# Patient Record
Sex: Male | Born: 1940 | Race: White | Hispanic: No | Marital: Married | Smoking: Former smoker
Health system: Southern US, Community
[De-identification: ages and names within clinical notes are randomized; demographics above are authoritative.]

## PROBLEM LIST (undated history)

## (undated) DIAGNOSIS — M109 Gout, unspecified: Secondary | ICD-10-CM

## (undated) DIAGNOSIS — Z8719 Personal history of other diseases of the digestive system: Secondary | ICD-10-CM

## (undated) DIAGNOSIS — E7800 Pure hypercholesterolemia, unspecified: Secondary | ICD-10-CM

## (undated) DIAGNOSIS — K219 Gastro-esophageal reflux disease without esophagitis: Secondary | ICD-10-CM

## (undated) HISTORY — PX: HERNIA REPAIR: SHX51

## (undated) HISTORY — PX: APPENDECTOMY: SHX54

---

## 2002-09-19 ENCOUNTER — Ambulatory Visit (HOSPITAL_COMMUNITY): Admission: RE | Admit: 2002-09-19 | Discharge: 2002-09-19 | Payer: Self-pay | Admitting: Gastroenterology

## 2002-10-12 ENCOUNTER — Emergency Department (HOSPITAL_COMMUNITY): Admission: EM | Admit: 2002-10-12 | Discharge: 2002-10-12 | Payer: Self-pay | Admitting: Emergency Medicine

## 2002-10-12 ENCOUNTER — Encounter: Payer: Self-pay | Admitting: Emergency Medicine

## 2011-10-19 ENCOUNTER — Emergency Department (HOSPITAL_COMMUNITY): Payer: Medicare Other

## 2011-10-19 ENCOUNTER — Other Ambulatory Visit: Payer: Self-pay

## 2011-10-19 ENCOUNTER — Encounter (HOSPITAL_COMMUNITY): Payer: Self-pay | Admitting: Emergency Medicine

## 2011-10-19 ENCOUNTER — Emergency Department (HOSPITAL_COMMUNITY)
Admission: EM | Admit: 2011-10-19 | Discharge: 2011-10-20 | Disposition: A | Discharge status: Home / Self Care | Payer: Medicare Other | Attending: Emergency Medicine | Admitting: Emergency Medicine

## 2011-10-19 DIAGNOSIS — R509 Fever, unspecified: Secondary | ICD-10-CM

## 2011-10-19 DIAGNOSIS — R109 Unspecified abdominal pain: Secondary | ICD-10-CM | POA: Insufficient documentation

## 2011-10-19 DIAGNOSIS — M546 Pain in thoracic spine: Secondary | ICD-10-CM | POA: Insufficient documentation

## 2011-10-19 DIAGNOSIS — R079 Chest pain, unspecified: Secondary | ICD-10-CM | POA: Insufficient documentation

## 2011-10-19 LAB — DIFFERENTIAL
Eosinophils Absolute: 0.1 10*3/uL (ref 0.0–0.7)
Lymphocytes Relative: 9 % — ABNORMAL LOW (ref 12–46)
Lymphs Abs: 1.7 10*3/uL (ref 0.7–4.0)
Monocytes Relative: 6 % (ref 3–12)
Neutro Abs: 15.5 10*3/uL — ABNORMAL HIGH (ref 1.7–7.7)
Neutrophils Relative %: 84 % — ABNORMAL HIGH (ref 43–77)

## 2011-10-19 LAB — URINE MICROSCOPIC-ADD ON

## 2011-10-19 LAB — POCT I-STAT TROPONIN I

## 2011-10-19 LAB — URINALYSIS, ROUTINE W REFLEX MICROSCOPIC
Glucose, UA: NEGATIVE mg/dL
Ketones, ur: NEGATIVE mg/dL
Leukocytes, UA: NEGATIVE
Nitrite: NEGATIVE
pH: 6 (ref 5.0–8.0)

## 2011-10-19 LAB — CBC
Hemoglobin: 16.8 g/dL (ref 13.0–17.0)
MCH: 29.7 pg (ref 26.0–34.0)
Platelets: 219 10*3/uL (ref 150–400)
RBC: 5.65 MIL/uL (ref 4.22–5.81)
WBC: 18.5 10*3/uL — ABNORMAL HIGH (ref 4.0–10.5)

## 2011-10-19 LAB — BASIC METABOLIC PANEL
BUN: 16 mg/dL (ref 6–23)
CO2: 24 mEq/L (ref 19–32)
Chloride: 103 mEq/L (ref 96–112)
GFR calc non Af Amer: 52 mL/min — ABNORMAL LOW (ref >90)
Glucose, Bld: 117 mg/dL — ABNORMAL HIGH (ref 70–99)
Potassium: 4 mEq/L (ref 3.5–5.1)
Sodium: 139 mEq/L (ref 135–145)

## 2011-10-19 LAB — HEPATIC FUNCTION PANEL
ALT: 21 U/L (ref 0–53)
AST: 20 U/L (ref 0–37)
Bilirubin, Direct: <0.1 mg/dL (ref 0.0–0.3)
Total Protein: 7.5 g/dL (ref 6.0–8.3)

## 2011-10-19 MED ORDER — ACETAMINOPHEN 325 MG PO TABS
975.0000 mg | ORAL_TABLET | Freq: Once | ORAL | Status: AC
  Administered 2011-10-19: 975 mg via ORAL
  Filled 2011-10-19: qty 3

## 2011-10-19 NOTE — ED Provider Notes (Signed)
History     CSN: [id number]  Arrival date & time 10/19/11  2017   First MD Initiated Contact with Patient 10/19/11 2107      Chief Complaint  Patient presents with  . Chest Pain    (Consider location/radiation/quality/duration/timing/severity/associated sxs/prior treatment) HPI Complains of right flank pain radiating to epigastrium onset this morning constant mild at present no shortness of breath no nausea no cough no other complaint discomfort is mild at present nothing makes symptoms better or worse no treatment prior to coming here History reviewed. No pertinent past medical history.  Past Surgical History  Procedure Date  . Cholecystectomy     No family history on file.  History  Substance Use Topics  . Smoking status: Never Smoker   . Smokeless tobacco: Not on file  . Alcohol Use: No     former smoker quit 1984, no alcohol since 1984  Review of Systems  Constitutional: Negative.   HENT: Negative.   Respiratory: Negative.   Cardiovascular: Negative.   Gastrointestinal: Positive for abdominal pain.  Genitourinary: Positive for flank pain.  Musculoskeletal: Negative.   Skin: Negative.   Neurological: Negative.   Hematological: Negative.   Psychiatric/Behavioral: Negative.     Allergies  Review of patient's allergies indicates no known allergies.  Home Medications   Current Outpatient Rx  Name Route Sig Dispense Refill  . FENOFIBRATE MICRONIZED 134 MG PO CAPS Oral Take 134 mg by mouth daily before breakfast.    . OMEPRAZOLE 20 MG PO CPDR Oral Take 20 mg by mouth daily.      BP 184/99  Pulse 109  Temp(Src) 99 F (37.2 C) (Oral)  Resp 20  Wt 167 lb (75.751 kg)  SpO2 98%  Physical Exam  Nursing note and vitals reviewed. Constitutional: He appears well-developed and well-nourished.  HENT:  Head: Normocephalic and atraumatic.  Eyes: Conjunctivae are normal. Pupils are equal, round, and reactive to light.  Neck: Neck supple. No tracheal deviation  present. No thyromegaly present.  Cardiovascular: Regular rhythm.   No murmur heard. Pulmonary/Chest: Effort normal and breath sounds normal.  Abdominal: Soft. Bowel sounds are normal. He exhibits no distension. There is no tenderness. There is no rebound.  Genitourinary: Penis normal.       Mild right flank tenderness  Musculoskeletal: Normal range of motion. He exhibits no edema and no tenderness.  Neurological: He is alert. Coordination normal.  Skin: Skin is warm and dry. No rash noted.  Psychiatric: He has a normal mood and affect.    ED Course  Procedures (including critical care time)   Labs Reviewed  I-STAT TROPONIN I  CBC  DIFFERENTIAL  BASIC METABOLIC PANEL   No results found.   No diagnosis found.  Date: 10/19/2011  Rate: 110  Rhythm: sinus tachycardia  QRS Axis: normal  Intervals: normal  ST/T Wave abnormalities: normal  Conduction Disutrbances:none  Narrative Interpretation:   Old EKG Reviewed: none available  Results for orders placed during the hospital encounter of 10/19/11  CBC      Component Value Range   WBC 18.5 (*) 4.0 - 10.5 (K/uL)   RBC 5.65  4.22 - 5.81 (MIL/uL)   Hemoglobin 16.8  13.0 - 17.0 (g/dL)   HCT 08.6  57.8 - 46.9 (%)   MCV 83.9  78.0 - 100.0 (fL)   MCH 29.7  26.0 - 34.0 (pg)   MCHC 35.4  30.0 - 36.0 (g/dL)   RDW 62.9  52.8 - 41.3 (%)   Platelets 219  150 - 400 (K/uL)  DIFFERENTIAL      Component Value Range   Neutrophils Relative 84 (*) 43 - 77 (%)   Neutro Abs 15.5 (*) 1.7 - 7.7 (K/uL)   Lymphocytes Relative 9 (*) 12 - 46 (%)   Lymphs Abs 1.7  0.7 - 4.0 (K/uL)   Monocytes Relative 6  3 - 12 (%)   Monocytes Absolute 1.1 (*) 0.1 - 1.0 (K/uL)   Eosinophils Relative 1  0 - 5 (%)   Eosinophils Absolute 0.1  0.0 - 0.7 (K/uL)   Basophils Relative 0  0 - 1 (%)   Basophils Absolute 0.0  0.0 - 0.1 (K/uL)  BASIC METABOLIC PANEL      Component Value Range   Sodium 139  135 - 145 (mEq/L)   Potassium 4.0  3.5 - 5.1 (mEq/L)    Chloride 103  96 - 112 (mEq/L)   CO2 24  19 - 32 (mEq/L)   Glucose, Bld 117 (*) 70 - 99 (mg/dL)   BUN 16  6 - 23 (mg/dL)   Creatinine, Ser 6.96  0.50 - 1.35 (mg/dL)   Calcium 29.5  8.4 - 10.5 (mg/dL)   GFR calc non Af Amer 52 (*) >90 (mL/min)   GFR calc Af Amer 61 (*) >90 (mL/min)  POCT I-STAT TROPONIN I      Component Value Range   Troponin i, poc 0.00  0.00 - 0.08 (ng/mL)   Comment 3           URINALYSIS, ROUTINE W REFLEX MICROSCOPIC      Component Value Range   Color, Urine YELLOW  YELLOW    APPearance CLEAR  CLEAR    Specific Gravity, Urine 1.020  1.005 - 1.030    pH 6.0  5.0 - 8.0    Glucose, UA NEGATIVE  NEGATIVE (mg/dL)   Hgb urine dipstick TRACE (*) NEGATIVE    Bilirubin Urine NEGATIVE  NEGATIVE    Ketones, ur NEGATIVE  NEGATIVE (mg/dL)   Protein, ur NEGATIVE  NEGATIVE (mg/dL)   Urobilinogen, UA 0.2  0.0 - 1.0 (mg/dL)   Nitrite NEGATIVE  NEGATIVE    Leukocytes, UA NEGATIVE  NEGATIVE   HEPATIC FUNCTION PANEL      Component Value Range   Total Protein 7.5  6.0 - 8.3 (g/dL)   Albumin 4.2  3.5 - 5.2 (g/dL)   AST 20  0 - 37 (U/L)   ALT 21  0 - 53 (U/L)   Alkaline Phosphatase 57  39 - 117 (U/L)   Total Bilirubin 0.4  0.3 - 1.2 (mg/dL)   Bilirubin, Direct <2.8  0.0 - 0.3 (mg/dL)   Indirect Bilirubin NOT CALCULATED  0.3 - 0.9 (mg/dL)  URINE MICROSCOPIC-ADD ON      Component Value Range   Squamous Epithelial / LPF RARE  RARE    WBC, UA 0-2  <3 (WBC/hpf)   RBC / HPF 0-2  <3 (RBC/hpf)   Bacteria, UA RARE  RARE    Dg Chest 2 View  10/19/2011  *RADIOLOGY REPORT*  Clinical Data: Chest pain and pressure.  Mid back pain.  CHEST - 2 VIEW  Comparison: None.  Findings: Shallow inspiration.  Linear atelectasis or infiltration in the lung bases.  Normal heart size and pulmonary vascularity. No blunting of costophrenic angles.  No pneumothorax.  IMPRESSION: Linear atelectasis or infiltration in the lung bases.  Original Report Authenticated By: Marlon Pel, M.D.   12 midnight.  Patient resting comfortably and feels improved after  treatment with Tylenol  MDM  No source of fever identified. Doubt pneumonia no cough lungs clear auscultation Plan Tylenol Patient is encouraged to get reevaluated by his primary care Dr. within 24 hours Diagnosis febrile illness        Doug Sou, MD 10/20/11 1610

## 2011-10-19 NOTE — ED Notes (Signed)
PT. REPORTS MIDSTERNAL CHEST /EPIGASTRIC PAIN RADIATING TO RIGHT UPPER BACK ONSET THIS MORNING , DENIES SOB , SLIGHT NAUSEA, TOOK 2 REGULAR ASA PRIOR TO ARRIVAL.

## 2011-10-19 NOTE — ED Notes (Addendum)
Pt states he was having right flank pain around 0500 this morning.  The pain progressed to epigastric - non-radiating.  Pt states he felt nauseous but denies any vomiting or SOB.  Pt does state he takes medication for acid reflux but cannot remember the name of it.

## 2015-05-16 ENCOUNTER — Ambulatory Visit
Admission: RE | Admit: 2015-05-16 | Discharge: 2015-05-16 | Disposition: A | Discharge status: Home / Self Care | Payer: Medicare Other | Source: Ambulatory Visit | Attending: Family Medicine | Admitting: Family Medicine

## 2015-05-16 ENCOUNTER — Other Ambulatory Visit: Payer: Self-pay | Admitting: Family Medicine

## 2015-05-16 DIAGNOSIS — M25572 Pain in left ankle and joints of left foot: Secondary | ICD-10-CM

## 2016-06-24 ENCOUNTER — Encounter: Payer: Self-pay | Admitting: *Deleted

## 2016-06-26 NOTE — Discharge Instructions (Signed)

## 2016-07-01 ENCOUNTER — Ambulatory Visit: Payer: Medicare Other | Admitting: Anesthesiology

## 2016-07-01 ENCOUNTER — Ambulatory Visit
Admission: RE | Admit: 2016-07-01 | Discharge: 2016-07-01 | Disposition: A | Discharge status: Home / Self Care | Payer: Medicare Other | Source: Ambulatory Visit | Attending: Ophthalmology | Admitting: Ophthalmology

## 2016-07-01 ENCOUNTER — Encounter
Admission: RE | Disposition: A | Discharge status: Home / Self Care | Payer: Self-pay | Source: Ambulatory Visit | Attending: Ophthalmology

## 2016-07-01 DIAGNOSIS — M109 Gout, unspecified: Secondary | ICD-10-CM | POA: Diagnosis not present

## 2016-07-01 DIAGNOSIS — H2511 Age-related nuclear cataract, right eye: Secondary | ICD-10-CM | POA: Diagnosis not present

## 2016-07-01 DIAGNOSIS — E7800 Pure hypercholesterolemia, unspecified: Secondary | ICD-10-CM | POA: Diagnosis not present

## 2016-07-01 DIAGNOSIS — Z87891 Personal history of nicotine dependence: Secondary | ICD-10-CM | POA: Insufficient documentation

## 2016-07-01 DIAGNOSIS — Z79899 Other long term (current) drug therapy: Secondary | ICD-10-CM | POA: Insufficient documentation

## 2016-07-01 HISTORY — PX: CATARACT EXTRACTION W/PHACO: SHX586

## 2016-07-01 HISTORY — DX: Gout, unspecified: M10.9

## 2016-07-01 HISTORY — DX: Pure hypercholesterolemia, unspecified: E78.00

## 2016-07-01 HISTORY — DX: Gastro-esophageal reflux disease without esophagitis: K21.9

## 2016-07-01 HISTORY — DX: Personal history of other diseases of the digestive system: Z87.19

## 2016-07-01 SURGERY — PHACOEMULSIFICATION, CATARACT, WITH IOL INSERTION
Anesthesia: Monitor Anesthesia Care | Laterality: Right | Wound class: Clean

## 2016-07-01 MED ORDER — LIDOCAINE HCL (PF) 4 % IJ SOLN
INTRAMUSCULAR | Status: DC | PRN
  Administered 2016-07-01: 09:00:00 via OPHTHALMIC

## 2016-07-01 MED ORDER — SODIUM HYALURONATE 23 MG/ML IO SOLN
INTRAOCULAR | Status: DC | PRN
  Administered 2016-07-01: 0.6 mL via INTRAOCULAR

## 2016-07-01 MED ORDER — FENTANYL CITRATE (PF) 100 MCG/2ML IJ SOLN
INTRAMUSCULAR | Status: DC | PRN
  Administered 2016-07-01: 50 ug via INTRAVENOUS

## 2016-07-01 MED ORDER — EPINEPHRINE PF 1 MG/ML IJ SOLN
INTRAOCULAR | Status: DC | PRN
  Administered 2016-07-01: 79 mL via OPHTHALMIC

## 2016-07-01 MED ORDER — SODIUM HYALURONATE 10 MG/ML IO SOLN
INTRAOCULAR | Status: DC | PRN
  Administered 2016-07-01: 0.85 mL via INTRAOCULAR

## 2016-07-01 MED ORDER — ARMC OPHTHALMIC DILATING DROPS
1.0000 "application " | OPHTHALMIC | Status: DC | PRN
  Administered 2016-07-01 (×3): 1 via OPHTHALMIC

## 2016-07-01 MED ORDER — MOXIFLOXACIN HCL 0.5 % OP SOLN
OPHTHALMIC | Status: DC | PRN
  Administered 2016-07-01: 1 [drp] via OPHTHALMIC

## 2016-07-01 MED ORDER — LIDOCAINE HCL (PF) 4 % IJ SOLN
INTRAOCULAR | Status: DC | PRN
  Administered 2016-07-01: 2 mL via OPHTHALMIC

## 2016-07-01 MED ORDER — MIDAZOLAM HCL 2 MG/2ML IJ SOLN
INTRAMUSCULAR | Status: DC | PRN
Start: 2016-07-01 — End: 2016-07-01
  Administered 2016-07-01: 2 mg via INTRAVENOUS

## 2016-07-01 MED ORDER — NEOMYCIN-POLYMYXIN-DEXAMETH 3.5-10000-0.1 OP OINT
TOPICAL_OINTMENT | OPHTHALMIC | Status: DC | PRN
  Administered 2016-07-01: 1 via OPHTHALMIC

## 2016-07-01 MED ORDER — ALFENTANIL 500 MCG/ML IJ INJ
INJECTION | INTRAMUSCULAR | Status: DC | PRN
  Administered 2016-07-01: 400 ug via INTRAVENOUS

## 2016-07-01 MED ORDER — LACTATED RINGERS IV SOLN: INTRAVENOUS | Status: DC

## 2016-07-01 SURGICAL SUPPLY — 18 items
CANNULA ANT/CHMB 27GA (MISCELLANEOUS) ×4 IMPLANT
CUP MEDICINE 2OZ PLAST GRAD ST (MISCELLANEOUS) ×2 IMPLANT
GLOVE BIO SURGEON STRL SZ8 (GLOVE) ×2 IMPLANT
GLOVE SURG LX 7.5 STRW (GLOVE) ×1
GLOVE SURG LX STRL 7.5 STRW (GLOVE) ×1 IMPLANT
GOWN STRL REUS W/ TWL LRG LVL3 (GOWN DISPOSABLE) ×2 IMPLANT
GOWN STRL REUS W/TWL LRG LVL3 (GOWN DISPOSABLE) ×2
LENS IOL TECNIS ITEC 23.5 (Intraocular Lens) ×2 IMPLANT
PACK CATARACT (MISCELLANEOUS) ×2 IMPLANT
PACK CATARACT BRASINGTON (MISCELLANEOUS) ×2 IMPLANT
PACK EYE AFTER SURG (MISCELLANEOUS) ×2 IMPLANT
SOL PREP PVP 2OZ (MISCELLANEOUS) ×2
SOLUTION PREP PVP 2OZ (MISCELLANEOUS) ×1 IMPLANT
SYR 3ML LL SCALE MARK (SYRINGE) ×4 IMPLANT
SYR 5ML LL (SYRINGE) ×2 IMPLANT
SYR TB 1ML 27GX1/2 LL (SYRINGE) ×2 IMPLANT
WATER STERILE IRR 250ML POUR (IV SOLUTION) ×2 IMPLANT
WIPE NON LINTING 3.25X3.25 (MISCELLANEOUS) ×2 IMPLANT

## 2016-07-01 NOTE — H&P (Signed)
The History and Physical notes are on paper, have been signed, and are to be scanned. The patient remains stable and unchanged from the H&P.   Previous H&P reviewed, patient examined, and there are no changes.  Brett Keller 07/01/2016 8:10 AM

## 2016-07-01 NOTE — Transfer of Care (Signed)
Immediate Anesthesia Transfer of Care Note  Patient: Brett Keller  Procedure(s) Performed: Procedure(s) with comments: CATARACT EXTRACTION PHACO AND INTRAOCULAR LENS PLACEMENT (IOC) (Right) - RIGHT  Patient Location: PACU  Anesthesia Type: MAC  Level of Consciousness: awake, alert  and patient cooperative  Airway and Oxygen Therapy: Patient Spontanous Breathing and Patient connected to supplemental oxygen  Post-op Assessment: Post-op Vital signs reviewed, Patient's Cardiovascular Status Stable, Respiratory Function Stable, Patent Airway and No signs of Nausea or vomiting  Post-op Vital Signs: Reviewed and stable  Complications: No apparent anesthesia complications

## 2016-07-01 NOTE — Anesthesia Procedure Notes (Signed)
Procedure Name: MAC Performed by: Archer Moist Pre-anesthesia Checklist: Patient identified, Emergency Drugs available, Suction available, Timeout performed and Patient being monitored Patient Re-evaluated:Patient Re-evaluated prior to inductionOxygen Delivery Method: Nasal cannula Placement Confirmation: positive ETCO2       

## 2016-07-01 NOTE — Anesthesia Preprocedure Evaluation (Signed)
Anesthesia Evaluation  Patient identified by MRN, date of birth, ID band  Reviewed: NPO status   History of Anesthesia Complications Negative for: history of anesthetic complications  Airway Mallampati: II  TM Distance: >3 FB Neck ROM: full    Dental  (+) Upper Dentures   Pulmonary neg pulmonary ROS, former smoker,    Pulmonary exam normal        Cardiovascular Exercise Tolerance: Good negative cardio ROS Normal cardiovascular exam  lipids   Neuro/Psych negative neurological ROS  negative psych ROS   GI/Hepatic Neg liver ROS, GERD  Controlled,  Endo/Other  negative endocrine ROS  Renal/GU negative Renal ROS  negative genitourinary   Musculoskeletal Gout    Abdominal   Peds  Hematology negative hematology ROS (+)   Anesthesia Other Findings   Reproductive/Obstetrics                             Anesthesia Physical Anesthesia Plan  ASA: II  Anesthesia Plan: MAC   Post-op Pain Management:    Induction:   Airway Management Planned:   Additional Equipment:   Intra-op Plan:   Post-operative Plan:   Informed Consent: I have reviewed the patients History and Physical, chart, labs and discussed the procedure including the risks, benefits and alternatives for the proposed anesthesia with the patient or authorized representative who has indicated his/her understanding and acceptance.     Plan Discussed with: CRNA  Anesthesia Plan Comments:         Anesthesia Quick Evaluation

## 2016-07-01 NOTE — Anesthesia Postprocedure Evaluation (Signed)
Anesthesia Post Note  Patient: Brett KleinRobert E Keller  Procedure(s) Performed: Procedure(s) (LRB): CATARACT EXTRACTION PHACO AND INTRAOCULAR LENS PLACEMENT (IOC) (Right)  Patient location during evaluation: PACU Anesthesia Type: MAC Level of consciousness: awake and alert Pain management: pain level controlled Vital Signs Assessment: post-procedure vital signs reviewed and stable Respiratory status: spontaneous breathing, nonlabored ventilation, respiratory function stable and patient connected to nasal cannula oxygen Cardiovascular status: stable and blood pressure returned to baseline Anesthetic complications: no    Lincoln Ginley

## 2016-07-01 NOTE — Op Note (Signed)
OPERATIVE NOTE  Stark KleinRobert E Klee [id number] 07/01/2016   PREOPERATIVE DIAGNOSIS:  Nuclear sclerotic cataract right eye.  H25.11   POSTOPERATIVE DIAGNOSIS:    Nuclear sclerotic cataract right eye.     PROCEDURE:  Phacoemusification with posterior chamber intraocular lens placement of the right eye   LENS:   Implant Name Type Inv. Item Serial No. Manufacturer Lot No. LRB No. Used  LENS IOL DIOP 23.5 - [id number] Intraocular Lens LENS IOL DIOP 23.5 [id number]   Right 1       PCB00 +23.5   ULTRASOUND TIME: 1 minutes 13 seconds.  CDE 6.94   SURGEON:  Willey BladeBradley Elishia Kaczorowski, MD, MPH  ANESTHESIOLOGIST: Anesthesiologist: Orrin Brighamebabrata Maji, MD CRNA: Jimmy PicketMichael Amyot, CRNA   ANESTHESIA:  Topical with tetracaine drops, augmented with 1% preservative-free intracameral lidocaine.  Also MAC and retrobulbar block with 50-50 mix of 2% lidocaine PF and 0.75% bupivicaine and 0.5 mL of hyaluronidase (hylenex), approx 4 mL total delivered.  ESTIMATED BLOOD LOSS: less than 1 mL.   COMPLICATIONS:  None.   DESCRIPTION OF PROCEDURE:  The patient was identified in the holding room and transported to the operating room and placed in the supine position under the operating microscope.  The right eye was identified as the operative eye and it was prepped and draped in the usual sterile ophthalmic fashion.   A 1.0 millimeter clear-corneal paracentesis was made at the 10:30 position.  The patient had a significant Bell's phenomenon, so a retrobulbar block was performed.   0.5 ml of preservative-free 1% lidocaine with epinephrine was injected into the anterior chamber.  The anterior chamber was filled with Healon 5 viscoelastic.  A 2.4 millimeter keratome was used to make a near-clear corneal incision at the 8:00 position.  A curvilinear capsulorrhexis was made with a cystotome and capsulorrhexis forceps.  Balanced salt solution was used to hydrodissect and hydrodelineate the nucleus.   Phacoemulsification was then  used in stop and chop fashion to remove the lens nucleus and epinucleus.  The remaining cortex was then removed using the irrigation and aspiration handpiece. Healon was then placed into the capsular bag to distend it for lens placement.  A lens was then injected into the capsular bag.  The remaining viscoelastic was aspirated.   Wounds were hydrated with balanced salt solution.  The anterior chamber was inflated to a physiologic pressure with balanced salt solution.   Intracameral vigamox 0.1 mL undiluted was injected into the eye.  No wound leaks were noted.  Topical Vigamox drops were applied to the eye.  The patient was taken to the recovery room in stable condition without complications of anesthesia or surgery  Willey BladeBradley Marlette Curvin 07/01/2016, 8:59 AM

## 2016-07-02 ENCOUNTER — Encounter: Payer: Self-pay | Admitting: Ophthalmology

## 2016-12-29 ENCOUNTER — Other Ambulatory Visit: Payer: Self-pay | Admitting: Family Medicine

## 2016-12-29 ENCOUNTER — Ambulatory Visit
Admission: RE | Admit: 2016-12-29 | Discharge: 2016-12-29 | Disposition: A | Discharge status: Home / Self Care | Payer: Medicare Other | Source: Ambulatory Visit | Attending: Family Medicine | Admitting: Family Medicine

## 2016-12-29 DIAGNOSIS — M79642 Pain in left hand: Secondary | ICD-10-CM

## 2017-05-08 DIAGNOSIS — Z125 Encounter for screening for malignant neoplasm of prostate: Secondary | ICD-10-CM | POA: Diagnosis not present

## 2017-05-08 DIAGNOSIS — Z1211 Encounter for screening for malignant neoplasm of colon: Secondary | ICD-10-CM | POA: Diagnosis not present

## 2017-05-08 DIAGNOSIS — N183 Chronic kidney disease, stage 3 (moderate): Secondary | ICD-10-CM | POA: Diagnosis not present

## 2017-05-08 DIAGNOSIS — Z9181 History of falling: Secondary | ICD-10-CM | POA: Diagnosis not present

## 2017-05-08 DIAGNOSIS — E782 Mixed hyperlipidemia: Secondary | ICD-10-CM | POA: Diagnosis not present

## 2017-05-08 DIAGNOSIS — M109 Gout, unspecified: Secondary | ICD-10-CM | POA: Diagnosis not present

## 2017-05-08 DIAGNOSIS — Z23 Encounter for immunization: Secondary | ICD-10-CM | POA: Diagnosis not present

## 2017-05-08 DIAGNOSIS — Z0000 Encounter for general adult medical examination without abnormal findings: Secondary | ICD-10-CM | POA: Diagnosis not present

## 2017-05-08 DIAGNOSIS — Z1389 Encounter for screening for other disorder: Secondary | ICD-10-CM | POA: Diagnosis not present

## 2017-05-08 DIAGNOSIS — E785 Hyperlipidemia, unspecified: Secondary | ICD-10-CM | POA: Diagnosis not present

## 2017-05-13 ENCOUNTER — Ambulatory Visit
Admission: RE | Admit: 2017-05-13 | Discharge: 2017-05-13 | Disposition: A | Discharge status: Home / Self Care | Payer: Medicare Other | Source: Ambulatory Visit | Attending: Physician Assistant | Admitting: Physician Assistant

## 2017-05-13 ENCOUNTER — Other Ambulatory Visit: Payer: Self-pay | Admitting: Physician Assistant

## 2017-05-13 DIAGNOSIS — M79642 Pain in left hand: Secondary | ICD-10-CM

## 2017-05-13 DIAGNOSIS — M109 Gout, unspecified: Secondary | ICD-10-CM | POA: Diagnosis not present

## 2017-05-13 DIAGNOSIS — M12842 Other specific arthropathies, not elsewhere classified, left hand: Secondary | ICD-10-CM | POA: Diagnosis not present

## 2017-05-13 DIAGNOSIS — E782 Mixed hyperlipidemia: Secondary | ICD-10-CM | POA: Diagnosis not present

## 2017-06-10 DIAGNOSIS — Z1212 Encounter for screening for malignant neoplasm of rectum: Secondary | ICD-10-CM | POA: Diagnosis not present

## 2017-06-10 DIAGNOSIS — Z1211 Encounter for screening for malignant neoplasm of colon: Secondary | ICD-10-CM | POA: Diagnosis not present

## 2017-09-07 DIAGNOSIS — H5212 Myopia, left eye: Secondary | ICD-10-CM | POA: Diagnosis not present

## 2017-09-07 DIAGNOSIS — H52223 Regular astigmatism, bilateral: Secondary | ICD-10-CM | POA: Diagnosis not present

## 2017-11-18 DIAGNOSIS — R7303 Prediabetes: Secondary | ICD-10-CM | POA: Diagnosis not present

## 2017-11-18 DIAGNOSIS — M109 Gout, unspecified: Secondary | ICD-10-CM | POA: Diagnosis not present

## 2017-11-18 DIAGNOSIS — E782 Mixed hyperlipidemia: Secondary | ICD-10-CM | POA: Diagnosis not present

## 2017-11-18 DIAGNOSIS — N400 Enlarged prostate without lower urinary tract symptoms: Secondary | ICD-10-CM | POA: Diagnosis not present

## 2018-03-19 DIAGNOSIS — L03211 Cellulitis of face: Secondary | ICD-10-CM | POA: Diagnosis not present

## 2018-03-19 DIAGNOSIS — N400 Enlarged prostate without lower urinary tract symptoms: Secondary | ICD-10-CM | POA: Diagnosis not present

## 2018-05-21 DIAGNOSIS — T63481A Toxic effect of venom of other arthropod, accidental (unintentional), initial encounter: Secondary | ICD-10-CM | POA: Diagnosis not present

## 2018-05-21 DIAGNOSIS — Z6828 Body mass index (BMI) 28.0-28.9, adult: Secondary | ICD-10-CM | POA: Diagnosis not present

## 2018-07-01 DIAGNOSIS — N400 Enlarged prostate without lower urinary tract symptoms: Secondary | ICD-10-CM | POA: Diagnosis not present

## 2018-07-01 DIAGNOSIS — R7303 Prediabetes: Secondary | ICD-10-CM | POA: Diagnosis not present

## 2018-07-01 DIAGNOSIS — E782 Mixed hyperlipidemia: Secondary | ICD-10-CM | POA: Diagnosis not present

## 2018-07-01 DIAGNOSIS — M109 Gout, unspecified: Secondary | ICD-10-CM | POA: Diagnosis not present

## 2018-07-03 IMAGING — CR DG HAND COMPLETE 3+V*L*
4 series · 4 of 4 positions shown · non-contrast
Comparison: 12/29/2016

CLINICAL DATA: Recent removal of foreign body from the left index
finger. Concern for retained foreign body.

EXAM:
LEFT HAND - COMPLETE 3+ VIEW

[x hand pa left]
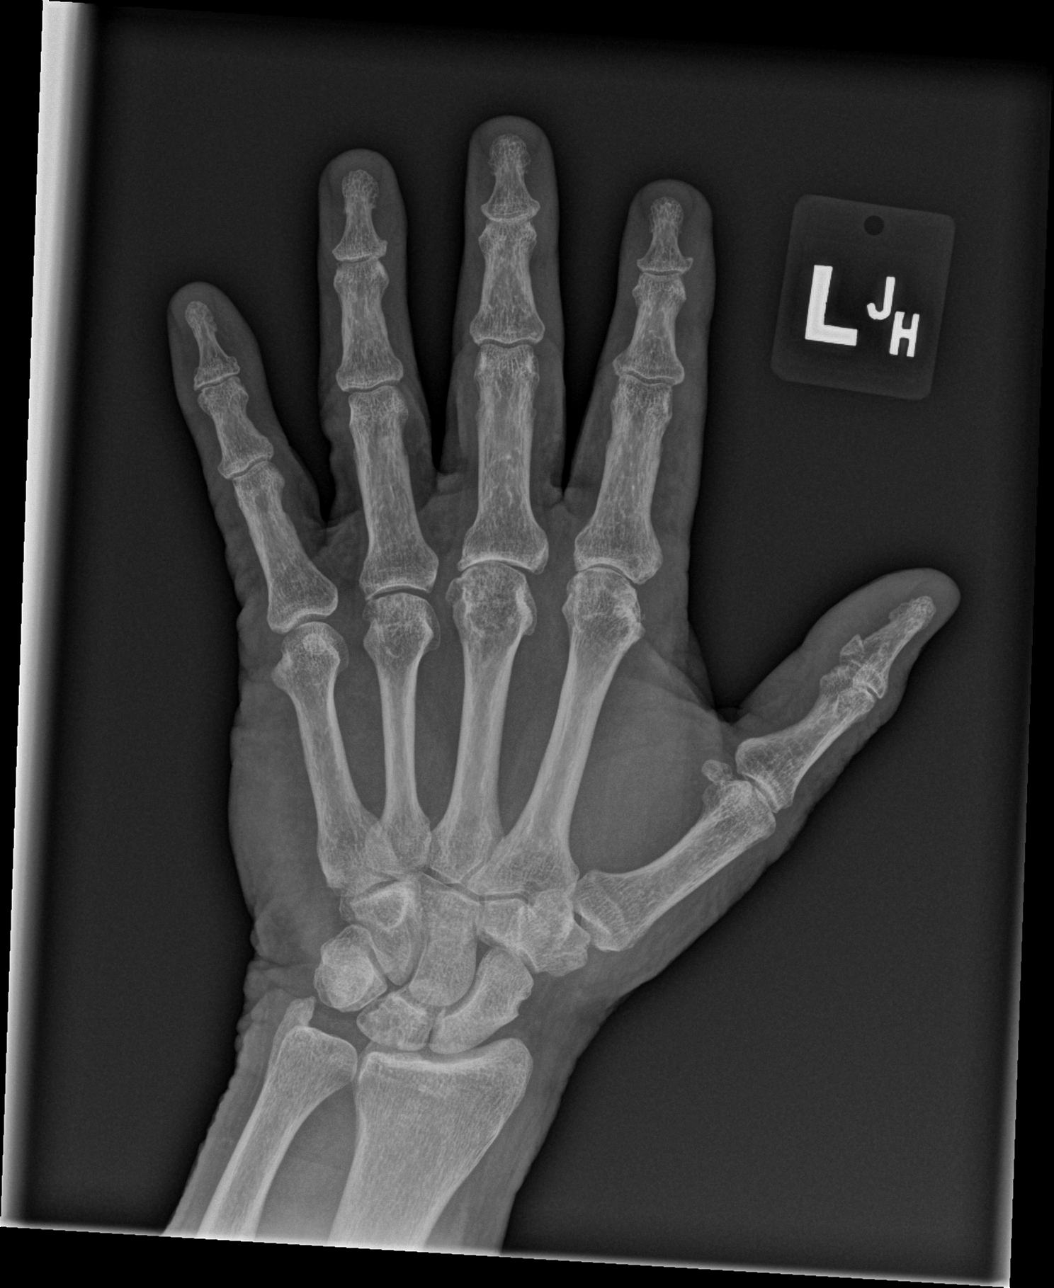

[x hand obl left]
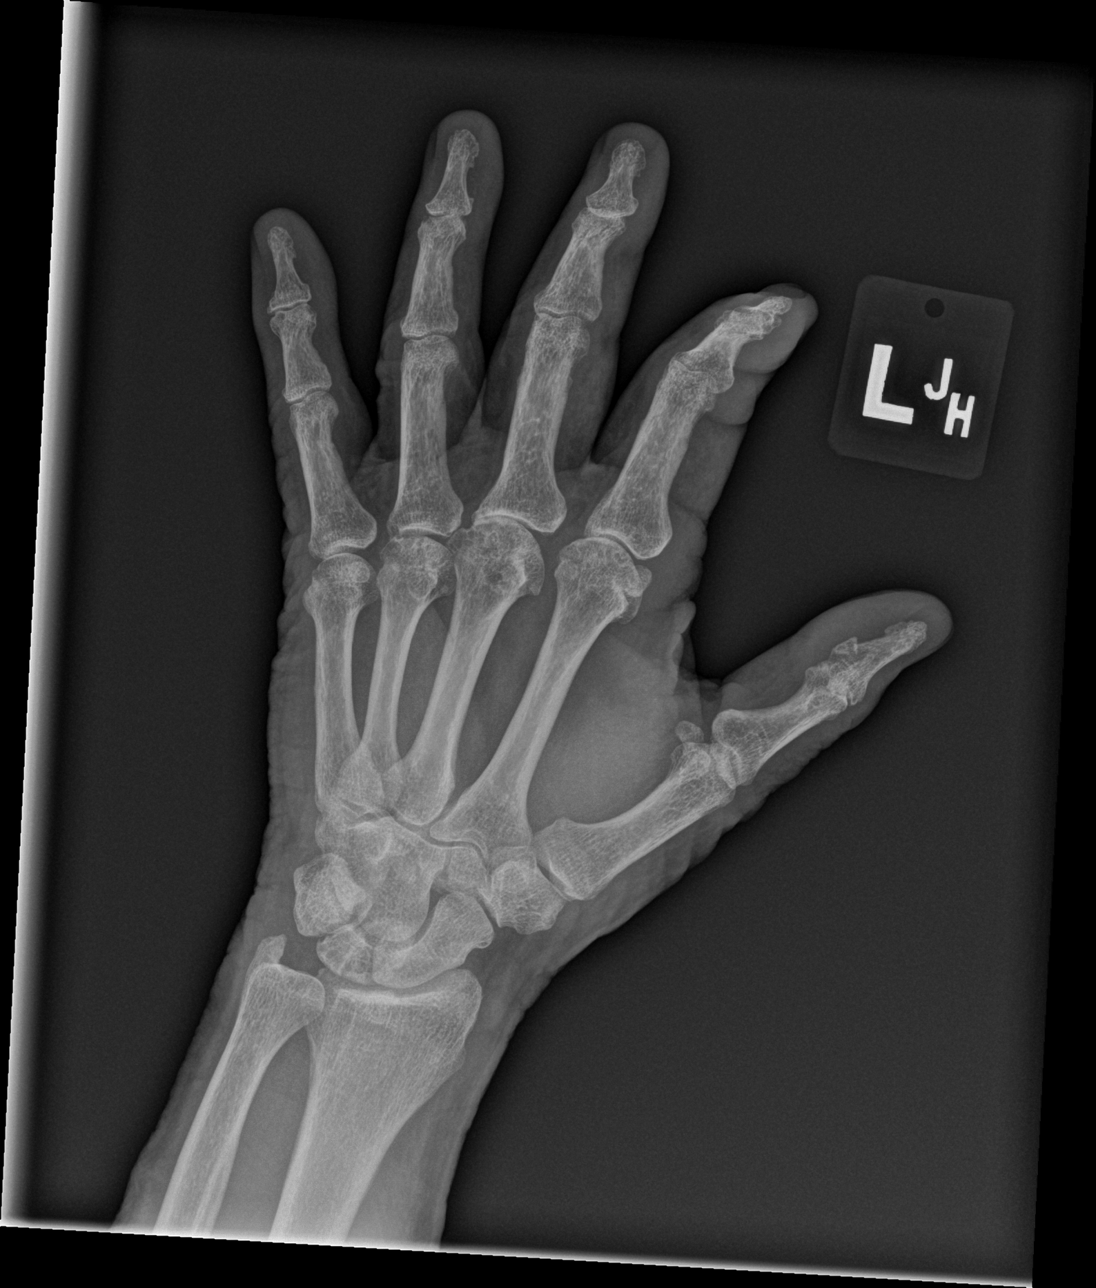

[x hand lat left (1 of 2)]
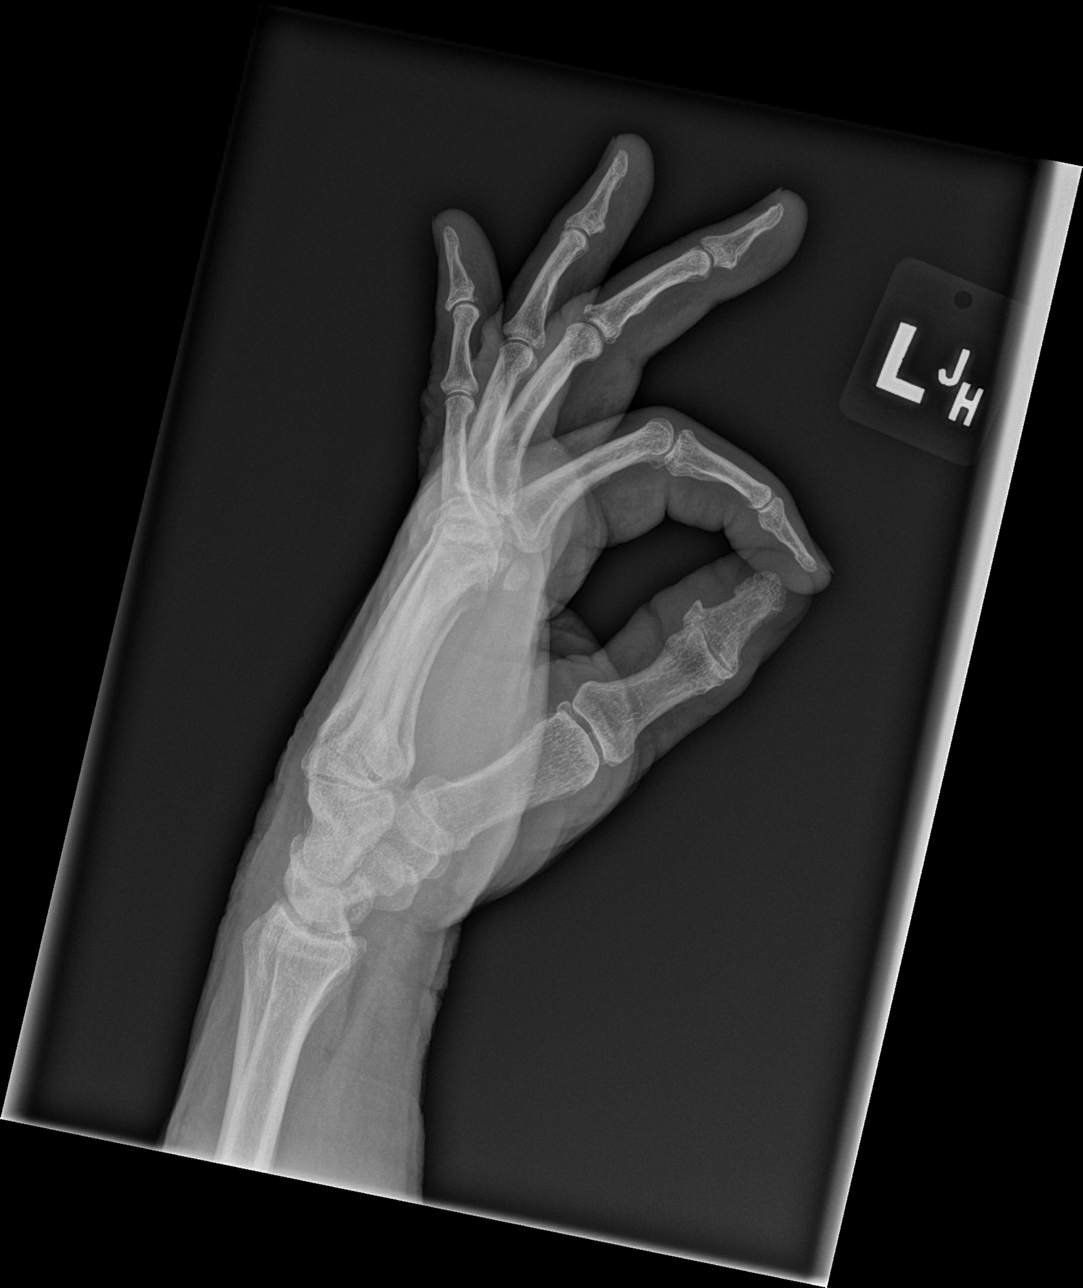

[x hand lat left (2 of 2)]
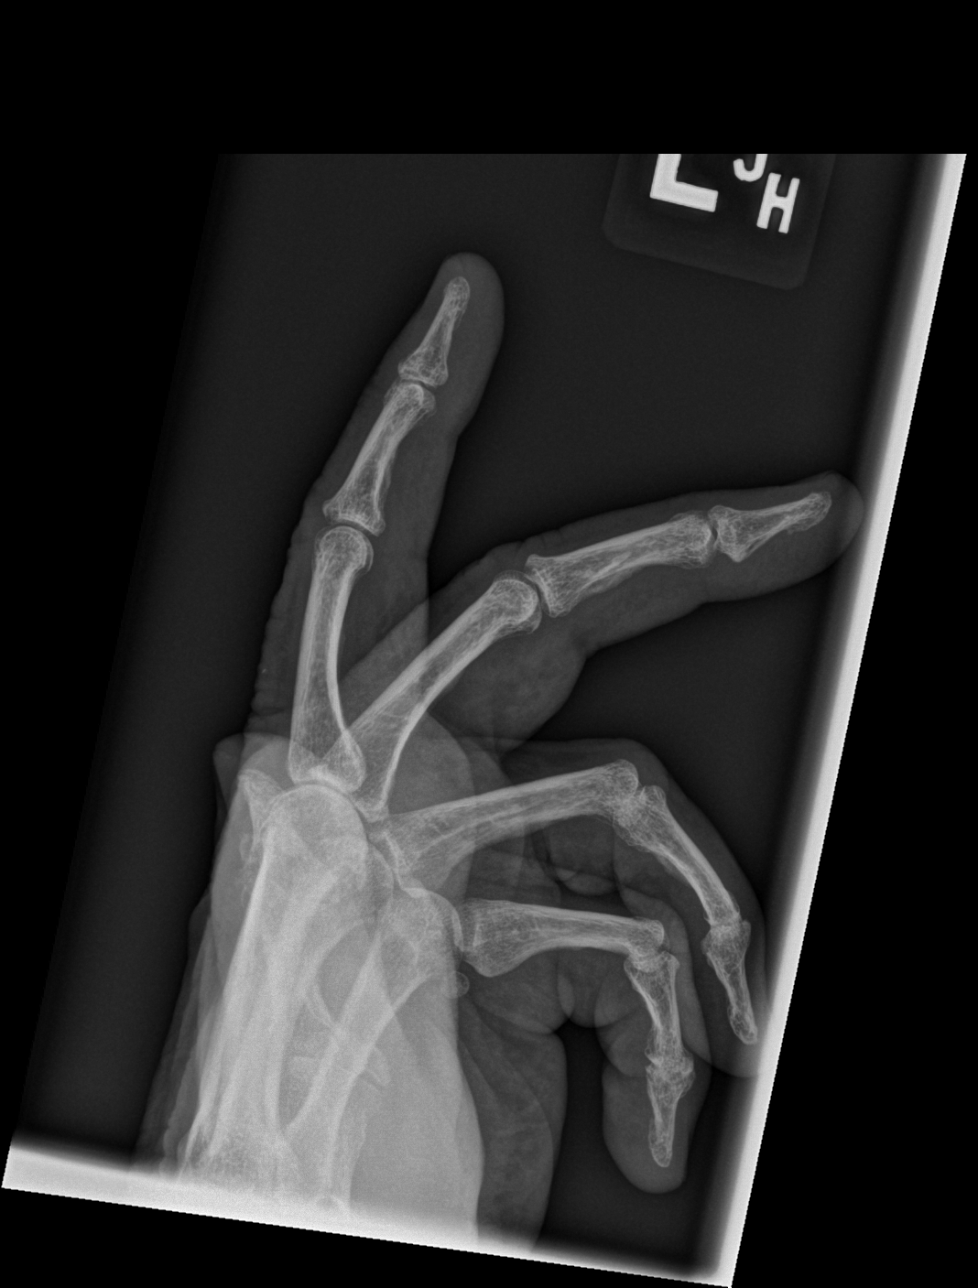

[4 of 4 positions shown; findings below may reference images not displayed]

FINDINGS: Opaque foreign body seen in the left index finger is no longer
visualized. No fracture, dislocation, or bony erosion. There is
joint narrowing and productive change greatest at the MCP joints,
especially the second and third. No associated chondrocalcinosis. In
the lateral projection, ovoid lucency over the dorsal base of the
middle finger middle phalanx is stable and may be from remote
trauma. Nonspecific chronic cystic change in the lunate.
IMPRESSION: 1. No acute finding. Foreign body seen 12/29/2016 is no longer
identified.
2. Arthritic narrowing greatest at the second through fourth MCP
joints.

## 2018-08-02 DIAGNOSIS — Z136 Encounter for screening for cardiovascular disorders: Secondary | ICD-10-CM | POA: Diagnosis not present

## 2018-08-02 DIAGNOSIS — Z125 Encounter for screening for malignant neoplasm of prostate: Secondary | ICD-10-CM | POA: Diagnosis not present

## 2018-08-02 DIAGNOSIS — Z139 Encounter for screening, unspecified: Secondary | ICD-10-CM | POA: Diagnosis not present

## 2018-08-02 DIAGNOSIS — Z0000 Encounter for general adult medical examination without abnormal findings: Secondary | ICD-10-CM | POA: Diagnosis not present

## 2019-01-06 DIAGNOSIS — M109 Gout, unspecified: Secondary | ICD-10-CM | POA: Diagnosis not present

## 2019-01-06 DIAGNOSIS — R7303 Prediabetes: Secondary | ICD-10-CM | POA: Diagnosis not present

## 2019-01-06 DIAGNOSIS — E782 Mixed hyperlipidemia: Secondary | ICD-10-CM | POA: Diagnosis not present

## 2019-01-06 DIAGNOSIS — N400 Enlarged prostate without lower urinary tract symptoms: Secondary | ICD-10-CM | POA: Diagnosis not present

## 2019-03-07 DIAGNOSIS — N183 Chronic kidney disease, stage 3 (moderate): Secondary | ICD-10-CM | POA: Diagnosis not present

## 2019-03-07 DIAGNOSIS — M109 Gout, unspecified: Secondary | ICD-10-CM | POA: Diagnosis not present

## 2019-03-07 DIAGNOSIS — E782 Mixed hyperlipidemia: Secondary | ICD-10-CM | POA: Diagnosis not present

## 2019-03-07 DIAGNOSIS — R7303 Prediabetes: Secondary | ICD-10-CM | POA: Diagnosis not present

## 2019-08-02 DIAGNOSIS — Z23 Encounter for immunization: Secondary | ICD-10-CM | POA: Diagnosis not present

## 2019-08-08 DIAGNOSIS — Z1331 Encounter for screening for depression: Secondary | ICD-10-CM | POA: Diagnosis not present

## 2019-08-08 DIAGNOSIS — Z0000 Encounter for general adult medical examination without abnormal findings: Secondary | ICD-10-CM | POA: Diagnosis not present

## 2019-08-08 DIAGNOSIS — Z9181 History of falling: Secondary | ICD-10-CM | POA: Diagnosis not present

## 2019-08-08 DIAGNOSIS — Z125 Encounter for screening for malignant neoplasm of prostate: Secondary | ICD-10-CM | POA: Diagnosis not present

## 2019-09-06 DIAGNOSIS — E782 Mixed hyperlipidemia: Secondary | ICD-10-CM | POA: Diagnosis not present

## 2019-09-06 DIAGNOSIS — N183 Chronic kidney disease, stage 3 unspecified: Secondary | ICD-10-CM | POA: Diagnosis not present

## 2019-09-06 DIAGNOSIS — Z125 Encounter for screening for malignant neoplasm of prostate: Secondary | ICD-10-CM | POA: Diagnosis not present

## 2019-09-06 DIAGNOSIS — M109 Gout, unspecified: Secondary | ICD-10-CM | POA: Diagnosis not present

## 2019-09-06 DIAGNOSIS — R7303 Prediabetes: Secondary | ICD-10-CM | POA: Diagnosis not present

## 2019-10-03 DIAGNOSIS — R21 Rash and other nonspecific skin eruption: Secondary | ICD-10-CM | POA: Diagnosis not present

## 2019-10-03 DIAGNOSIS — M549 Dorsalgia, unspecified: Secondary | ICD-10-CM | POA: Diagnosis not present

## 2019-10-03 DIAGNOSIS — Z6828 Body mass index (BMI) 28.0-28.9, adult: Secondary | ICD-10-CM | POA: Diagnosis not present

## 2019-11-17 DIAGNOSIS — Z0120 Encounter for dental examination and cleaning without abnormal findings: Secondary | ICD-10-CM | POA: Diagnosis not present

## 2019-11-24 ENCOUNTER — Other Ambulatory Visit: Payer: Self-pay | Admitting: Family Medicine

## 2019-11-24 ENCOUNTER — Other Ambulatory Visit: Payer: Self-pay

## 2019-11-24 ENCOUNTER — Ambulatory Visit
Admission: RE | Admit: 2019-11-24 | Discharge: 2019-11-24 | Disposition: A | Discharge status: Home / Self Care | Payer: Medicare Other | Source: Ambulatory Visit | Attending: Family Medicine | Admitting: Family Medicine

## 2019-11-24 DIAGNOSIS — M545 Low back pain, unspecified: Secondary | ICD-10-CM

## 2019-11-24 DIAGNOSIS — Z6828 Body mass index (BMI) 28.0-28.9, adult: Secondary | ICD-10-CM | POA: Diagnosis not present

## 2020-03-06 DIAGNOSIS — N183 Chronic kidney disease, stage 3 unspecified: Secondary | ICD-10-CM | POA: Diagnosis not present

## 2020-03-06 DIAGNOSIS — R7303 Prediabetes: Secondary | ICD-10-CM | POA: Diagnosis not present

## 2020-03-06 DIAGNOSIS — M109 Gout, unspecified: Secondary | ICD-10-CM | POA: Diagnosis not present

## 2020-03-06 DIAGNOSIS — E782 Mixed hyperlipidemia: Secondary | ICD-10-CM | POA: Diagnosis not present

## 2020-03-15 DIAGNOSIS — L570 Actinic keratosis: Secondary | ICD-10-CM | POA: Diagnosis not present

## 2020-06-05 DIAGNOSIS — E785 Hyperlipidemia, unspecified: Secondary | ICD-10-CM | POA: Diagnosis not present

## 2020-06-05 DIAGNOSIS — E1169 Type 2 diabetes mellitus with other specified complication: Secondary | ICD-10-CM | POA: Diagnosis not present

## 2020-06-05 DIAGNOSIS — Z6827 Body mass index (BMI) 27.0-27.9, adult: Secondary | ICD-10-CM | POA: Diagnosis not present

## 2020-08-03 DIAGNOSIS — B3742 Candidal balanitis: Secondary | ICD-10-CM | POA: Diagnosis not present

## 2020-08-03 DIAGNOSIS — E1169 Type 2 diabetes mellitus with other specified complication: Secondary | ICD-10-CM | POA: Diagnosis not present

## 2020-08-03 DIAGNOSIS — E785 Hyperlipidemia, unspecified: Secondary | ICD-10-CM | POA: Diagnosis not present

## 2020-08-03 DIAGNOSIS — Z6827 Body mass index (BMI) 27.0-27.9, adult: Secondary | ICD-10-CM | POA: Diagnosis not present

## 2020-08-09 DIAGNOSIS — Z0000 Encounter for general adult medical examination without abnormal findings: Secondary | ICD-10-CM | POA: Diagnosis not present

## 2020-08-09 DIAGNOSIS — Z1331 Encounter for screening for depression: Secondary | ICD-10-CM | POA: Diagnosis not present

## 2020-08-09 DIAGNOSIS — E785 Hyperlipidemia, unspecified: Secondary | ICD-10-CM | POA: Diagnosis not present

## 2020-08-09 DIAGNOSIS — Z9181 History of falling: Secondary | ICD-10-CM | POA: Diagnosis not present

## 2020-09-06 DIAGNOSIS — N1832 Chronic kidney disease, stage 3b: Secondary | ICD-10-CM | POA: Diagnosis not present

## 2020-09-06 DIAGNOSIS — M109 Gout, unspecified: Secondary | ICD-10-CM | POA: Diagnosis not present

## 2020-09-06 DIAGNOSIS — E782 Mixed hyperlipidemia: Secondary | ICD-10-CM | POA: Diagnosis not present

## 2020-09-06 DIAGNOSIS — E1169 Type 2 diabetes mellitus with other specified complication: Secondary | ICD-10-CM | POA: Diagnosis not present

## 2020-09-06 DIAGNOSIS — Z125 Encounter for screening for malignant neoplasm of prostate: Secondary | ICD-10-CM | POA: Diagnosis not present

## 2020-09-06 DIAGNOSIS — E785 Hyperlipidemia, unspecified: Secondary | ICD-10-CM | POA: Diagnosis not present

## 2020-09-28 DIAGNOSIS — Z961 Presence of intraocular lens: Secondary | ICD-10-CM | POA: Diagnosis not present

## 2020-09-28 DIAGNOSIS — H26493 Other secondary cataract, bilateral: Secondary | ICD-10-CM | POA: Diagnosis not present

## 2020-09-28 DIAGNOSIS — H35372 Puckering of macula, left eye: Secondary | ICD-10-CM | POA: Diagnosis not present

## 2020-09-28 DIAGNOSIS — E119 Type 2 diabetes mellitus without complications: Secondary | ICD-10-CM | POA: Diagnosis not present

## 2020-12-05 DIAGNOSIS — E785 Hyperlipidemia, unspecified: Secondary | ICD-10-CM | POA: Diagnosis not present

## 2020-12-05 DIAGNOSIS — E1169 Type 2 diabetes mellitus with other specified complication: Secondary | ICD-10-CM | POA: Diagnosis not present

## 2020-12-05 DIAGNOSIS — E782 Mixed hyperlipidemia: Secondary | ICD-10-CM | POA: Diagnosis not present

## 2020-12-05 DIAGNOSIS — N1831 Chronic kidney disease, stage 3a: Secondary | ICD-10-CM | POA: Diagnosis not present

## 2020-12-18 DIAGNOSIS — L570 Actinic keratosis: Secondary | ICD-10-CM | POA: Diagnosis not present

## 2021-01-13 IMAGING — CR DG LUMBAR SPINE COMPLETE 4+V
5 series · 5 of 5 positions shown · non-contrast
Comparison: None.

CLINICAL DATA: Episodic low back pain. Right-sided low back pain
for 1 month. No known injury.

EXAM:
LUMBAR SPINE - COMPLETE 4+ VIEW

[t lumbar spine ap]
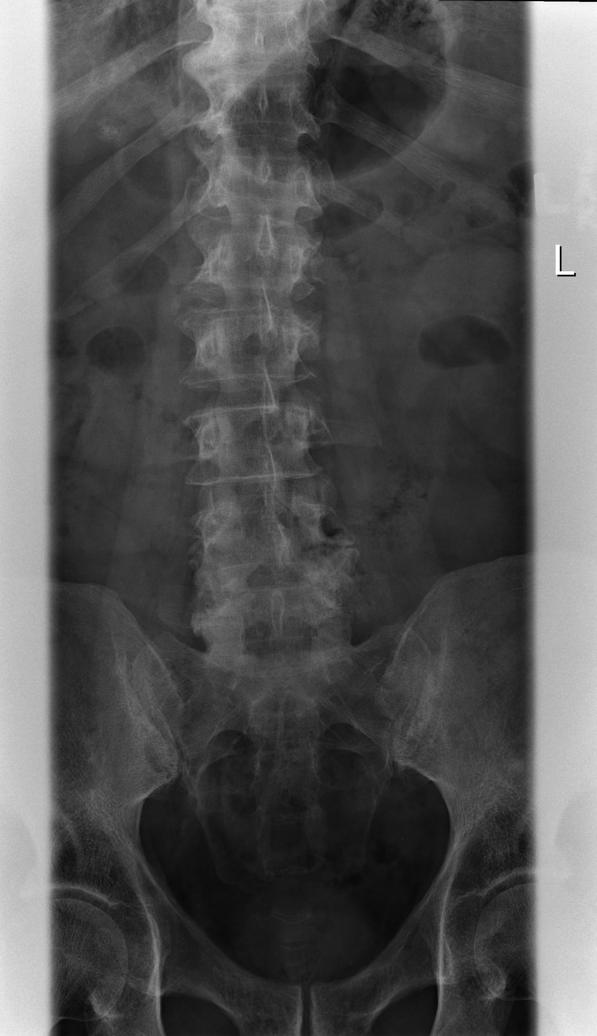

[t lumbar spine obl (1 of 2)]
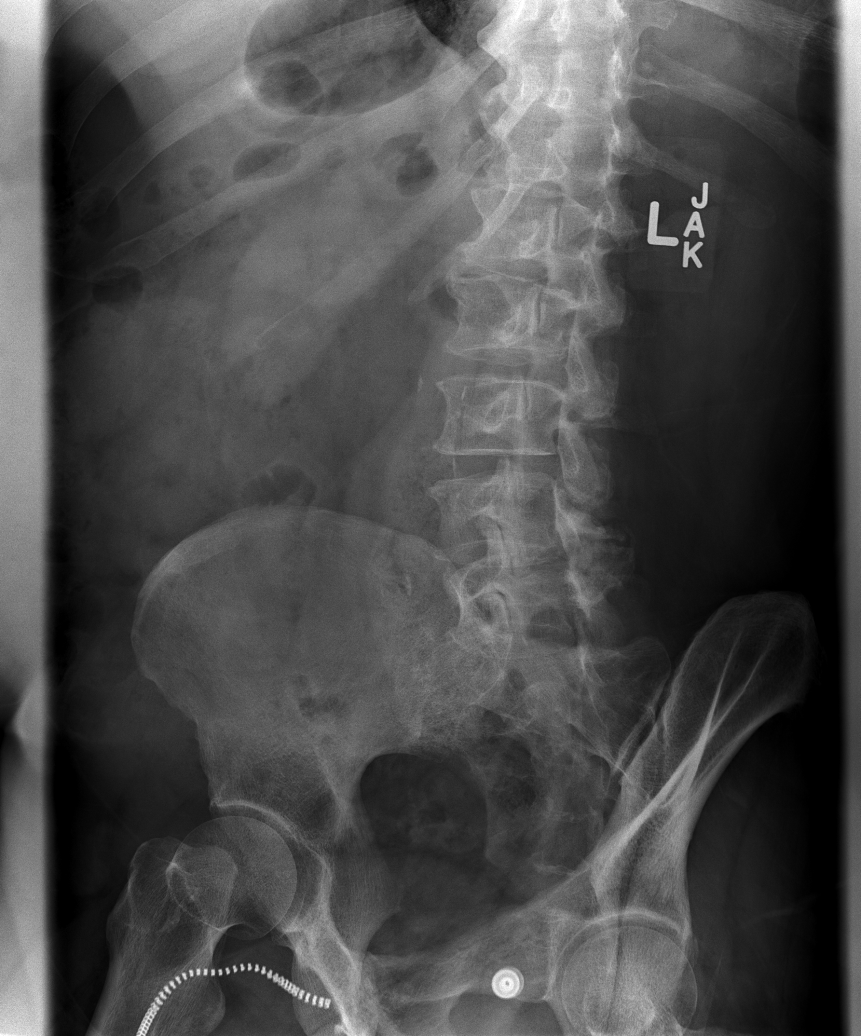

[t lumbar spine obl (2 of 2)]
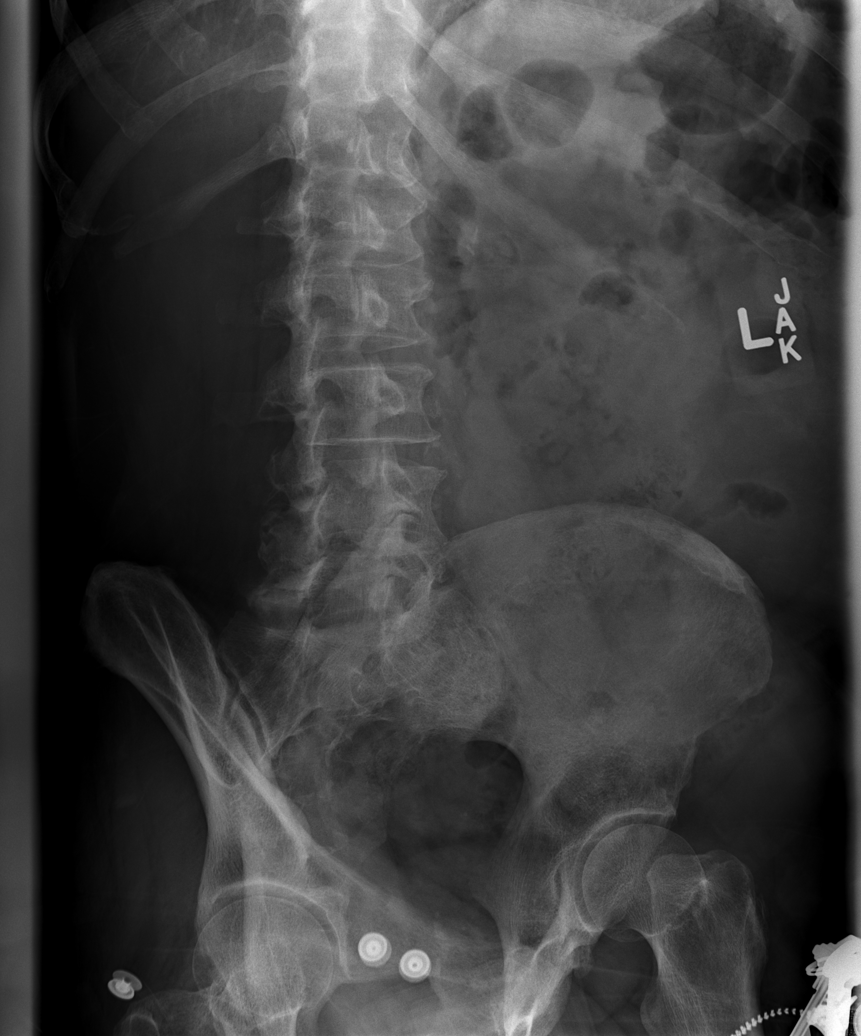

[t lumbar spine lat]
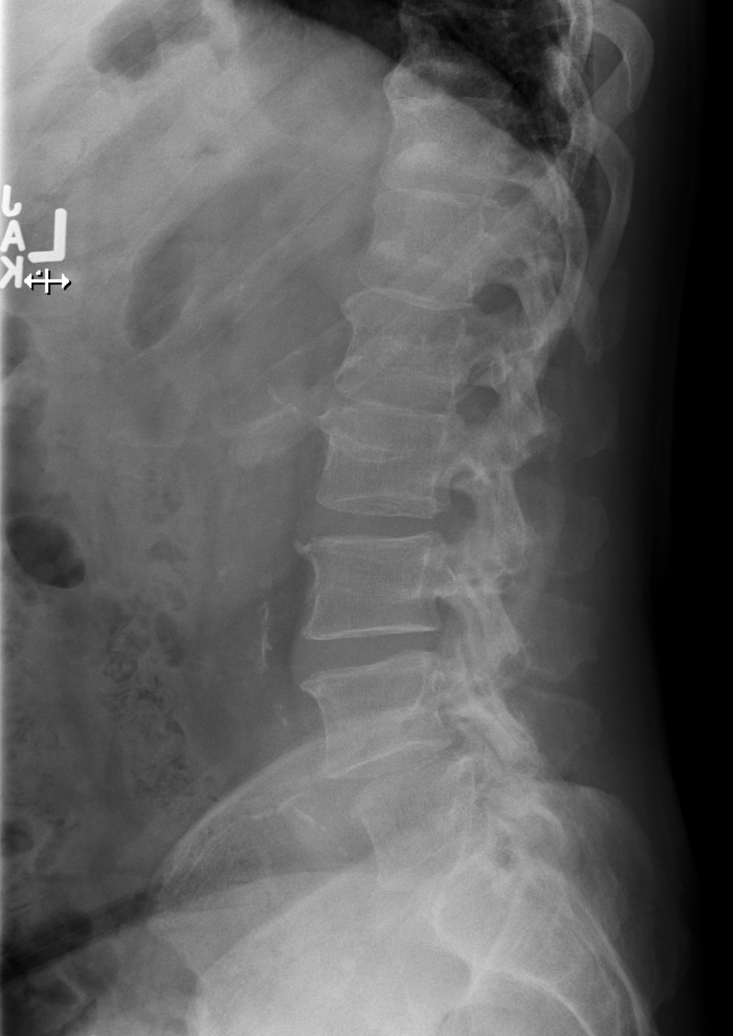

[t lumbar l-5 s-1 spot]
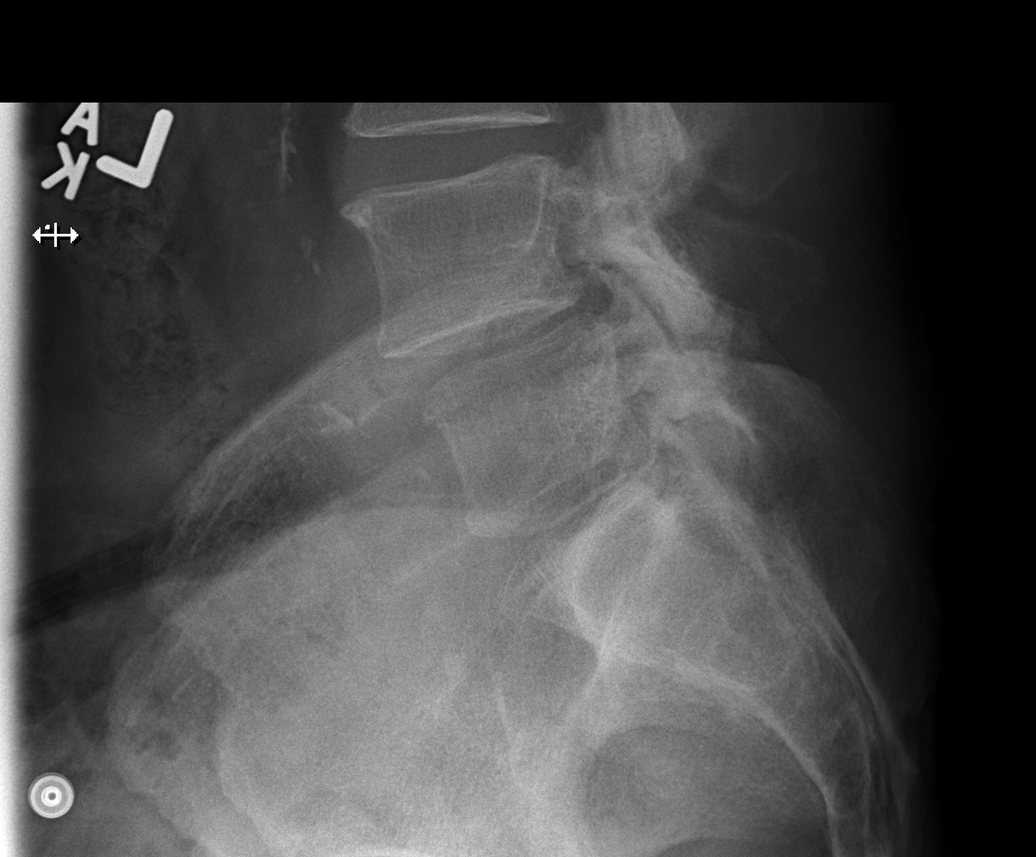

[5 of 5 positions shown; findings below may reference images not displayed]

FINDINGS: 3 mm anterolisthesis of L4 on L5, likely facet mediated with
prominent facet hypertrophy at this level. There is also mild disc
space narrowing and endplate spurring at this level. Slight
dextroscoliotic curvature of the upper lumbar spine. Mild L5-S1 disc
space narrowing and spurring. Endplate spurring at additional levels
with preservation of disc spaces. Facet hypertrophy at L5-S1.
Vertebral body heights are preserved. Degenerative change of both
sacroiliac joints. No evidence of fracture or focal bone lesion.
IMPRESSION: 1. Grade 1 anterolisthesis of L4 on L5, likely facet mediated with
prominent facet hypertrophy, as well as degenerative disc disease at
this level.
2. Degenerative disc disease and facet hypertrophy at L5-S1 to a
lesser extent.

## 2021-01-16 DIAGNOSIS — H903 Sensorineural hearing loss, bilateral: Secondary | ICD-10-CM | POA: Diagnosis not present

## 2021-03-08 DIAGNOSIS — E782 Mixed hyperlipidemia: Secondary | ICD-10-CM | POA: Diagnosis not present

## 2021-03-08 DIAGNOSIS — E1169 Type 2 diabetes mellitus with other specified complication: Secondary | ICD-10-CM | POA: Diagnosis not present

## 2021-03-08 DIAGNOSIS — M109 Gout, unspecified: Secondary | ICD-10-CM | POA: Diagnosis not present

## 2021-03-08 DIAGNOSIS — E785 Hyperlipidemia, unspecified: Secondary | ICD-10-CM | POA: Diagnosis not present

## 2021-03-08 DIAGNOSIS — N1831 Chronic kidney disease, stage 3a: Secondary | ICD-10-CM | POA: Diagnosis not present

## 2021-04-17 DIAGNOSIS — D2239 Melanocytic nevi of other parts of face: Secondary | ICD-10-CM | POA: Diagnosis not present

## 2021-04-17 DIAGNOSIS — L570 Actinic keratosis: Secondary | ICD-10-CM | POA: Diagnosis not present

## 2021-04-17 DIAGNOSIS — D225 Melanocytic nevi of trunk: Secondary | ICD-10-CM | POA: Diagnosis not present

## 2021-04-17 DIAGNOSIS — D485 Neoplasm of uncertain behavior of skin: Secondary | ICD-10-CM | POA: Diagnosis not present

## 2021-04-26 DIAGNOSIS — C44319 Basal cell carcinoma of skin of other parts of face: Secondary | ICD-10-CM | POA: Diagnosis not present

## 2021-05-31 DIAGNOSIS — C44319 Basal cell carcinoma of skin of other parts of face: Secondary | ICD-10-CM | POA: Diagnosis not present

## 2021-05-31 DIAGNOSIS — L570 Actinic keratosis: Secondary | ICD-10-CM | POA: Diagnosis not present

## 2021-06-24 DIAGNOSIS — E782 Mixed hyperlipidemia: Secondary | ICD-10-CM | POA: Diagnosis not present

## 2021-06-24 DIAGNOSIS — E1169 Type 2 diabetes mellitus with other specified complication: Secondary | ICD-10-CM | POA: Diagnosis not present

## 2021-06-24 DIAGNOSIS — N1831 Chronic kidney disease, stage 3a: Secondary | ICD-10-CM | POA: Diagnosis not present

## 2021-06-24 DIAGNOSIS — E785 Hyperlipidemia, unspecified: Secondary | ICD-10-CM | POA: Diagnosis not present

## 2021-06-24 DIAGNOSIS — M109 Gout, unspecified: Secondary | ICD-10-CM | POA: Diagnosis not present

## 2021-07-08 DIAGNOSIS — Z1212 Encounter for screening for malignant neoplasm of rectum: Secondary | ICD-10-CM | POA: Diagnosis not present

## 2021-07-08 DIAGNOSIS — Z1211 Encounter for screening for malignant neoplasm of colon: Secondary | ICD-10-CM | POA: Diagnosis not present

## 2021-07-13 LAB — COLOGUARD: COLOGUARD: NEGATIVE

## 2021-07-15 DIAGNOSIS — D225 Melanocytic nevi of trunk: Secondary | ICD-10-CM | POA: Diagnosis not present

## 2021-07-15 DIAGNOSIS — D2239 Melanocytic nevi of other parts of face: Secondary | ICD-10-CM | POA: Diagnosis not present

## 2021-07-15 DIAGNOSIS — D485 Neoplasm of uncertain behavior of skin: Secondary | ICD-10-CM | POA: Diagnosis not present

## 2021-07-15 DIAGNOSIS — L570 Actinic keratosis: Secondary | ICD-10-CM | POA: Diagnosis not present

## 2021-08-05 DIAGNOSIS — D0461 Carcinoma in situ of skin of right upper limb, including shoulder: Secondary | ICD-10-CM | POA: Diagnosis not present

## 2021-08-12 DIAGNOSIS — E785 Hyperlipidemia, unspecified: Secondary | ICD-10-CM | POA: Diagnosis not present

## 2021-08-12 DIAGNOSIS — Z1331 Encounter for screening for depression: Secondary | ICD-10-CM | POA: Diagnosis not present

## 2021-10-14 DIAGNOSIS — D0461 Carcinoma in situ of skin of right upper limb, including shoulder: Secondary | ICD-10-CM | POA: Diagnosis not present

## 2021-10-14 DIAGNOSIS — L570 Actinic keratosis: Secondary | ICD-10-CM | POA: Diagnosis not present

## 2021-10-14 DIAGNOSIS — L821 Other seborrheic keratosis: Secondary | ICD-10-CM | POA: Diagnosis not present

## 2021-10-29 DIAGNOSIS — E785 Hyperlipidemia, unspecified: Secondary | ICD-10-CM | POA: Diagnosis not present

## 2021-10-29 DIAGNOSIS — N1831 Chronic kidney disease, stage 3a: Secondary | ICD-10-CM | POA: Diagnosis not present

## 2021-10-29 DIAGNOSIS — E1169 Type 2 diabetes mellitus with other specified complication: Secondary | ICD-10-CM | POA: Diagnosis not present

## 2021-10-29 DIAGNOSIS — E782 Mixed hyperlipidemia: Secondary | ICD-10-CM | POA: Diagnosis not present

## 2021-10-29 DIAGNOSIS — Z125 Encounter for screening for malignant neoplasm of prostate: Secondary | ICD-10-CM | POA: Diagnosis not present

## 2022-01-28 DIAGNOSIS — N1831 Chronic kidney disease, stage 3a: Secondary | ICD-10-CM | POA: Diagnosis not present

## 2022-01-28 DIAGNOSIS — E785 Hyperlipidemia, unspecified: Secondary | ICD-10-CM | POA: Diagnosis not present

## 2022-01-28 DIAGNOSIS — E782 Mixed hyperlipidemia: Secondary | ICD-10-CM | POA: Diagnosis not present

## 2022-01-28 DIAGNOSIS — E1169 Type 2 diabetes mellitus with other specified complication: Secondary | ICD-10-CM | POA: Diagnosis not present

## 2022-01-28 DIAGNOSIS — M109 Gout, unspecified: Secondary | ICD-10-CM | POA: Diagnosis not present

## 2022-02-13 DIAGNOSIS — H524 Presbyopia: Secondary | ICD-10-CM | POA: Diagnosis not present

## 2022-02-13 DIAGNOSIS — H52223 Regular astigmatism, bilateral: Secondary | ICD-10-CM | POA: Diagnosis not present

## 2022-02-13 DIAGNOSIS — H5201 Hypermetropia, right eye: Secondary | ICD-10-CM | POA: Diagnosis not present

## 2022-04-14 DIAGNOSIS — L821 Other seborrheic keratosis: Secondary | ICD-10-CM | POA: Diagnosis not present

## 2022-04-14 DIAGNOSIS — L570 Actinic keratosis: Secondary | ICD-10-CM | POA: Diagnosis not present

## 2022-04-14 DIAGNOSIS — D225 Melanocytic nevi of trunk: Secondary | ICD-10-CM | POA: Diagnosis not present

## 2022-04-14 DIAGNOSIS — D2239 Melanocytic nevi of other parts of face: Secondary | ICD-10-CM | POA: Diagnosis not present

## 2022-05-23 DIAGNOSIS — E785 Hyperlipidemia, unspecified: Secondary | ICD-10-CM | POA: Diagnosis not present

## 2022-05-23 DIAGNOSIS — N183 Chronic kidney disease, stage 3 unspecified: Secondary | ICD-10-CM | POA: Diagnosis not present

## 2022-05-23 DIAGNOSIS — E1169 Type 2 diabetes mellitus with other specified complication: Secondary | ICD-10-CM | POA: Diagnosis not present

## 2022-05-23 DIAGNOSIS — M109 Gout, unspecified: Secondary | ICD-10-CM | POA: Diagnosis not present

## 2022-06-30 DIAGNOSIS — Z0000 Encounter for general adult medical examination without abnormal findings: Secondary | ICD-10-CM | POA: Diagnosis not present

## 2022-06-30 DIAGNOSIS — L209 Atopic dermatitis, unspecified: Secondary | ICD-10-CM | POA: Diagnosis not present

## 2022-06-30 DIAGNOSIS — E1169 Type 2 diabetes mellitus with other specified complication: Secondary | ICD-10-CM | POA: Diagnosis not present

## 2022-06-30 DIAGNOSIS — M109 Gout, unspecified: Secondary | ICD-10-CM | POA: Diagnosis not present

## 2022-06-30 DIAGNOSIS — E785 Hyperlipidemia, unspecified: Secondary | ICD-10-CM | POA: Diagnosis not present

## 2022-06-30 DIAGNOSIS — Z1331 Encounter for screening for depression: Secondary | ICD-10-CM | POA: Diagnosis not present

## 2022-09-09 DIAGNOSIS — H10021 Other mucopurulent conjunctivitis, right eye: Secondary | ICD-10-CM | POA: Diagnosis not present

## 2022-12-30 DIAGNOSIS — M109 Gout, unspecified: Secondary | ICD-10-CM | POA: Diagnosis not present

## 2022-12-30 DIAGNOSIS — E1122 Type 2 diabetes mellitus with diabetic chronic kidney disease: Secondary | ICD-10-CM | POA: Diagnosis not present

## 2022-12-30 DIAGNOSIS — E1169 Type 2 diabetes mellitus with other specified complication: Secondary | ICD-10-CM | POA: Diagnosis not present

## 2022-12-30 DIAGNOSIS — N1831 Chronic kidney disease, stage 3a: Secondary | ICD-10-CM | POA: Diagnosis not present

## 2023-01-29 DIAGNOSIS — E1122 Type 2 diabetes mellitus with diabetic chronic kidney disease: Secondary | ICD-10-CM | POA: Diagnosis not present

## 2023-01-29 DIAGNOSIS — E1165 Type 2 diabetes mellitus with hyperglycemia: Secondary | ICD-10-CM | POA: Diagnosis not present

## 2023-01-29 DIAGNOSIS — N1831 Chronic kidney disease, stage 3a: Secondary | ICD-10-CM | POA: Diagnosis not present

## 2024-03-08 ENCOUNTER — Encounter: Payer: Self-pay | Admitting: Pulmonary Disease

## 2024-03-08 ENCOUNTER — Ambulatory Visit (INDEPENDENT_AMBULATORY_CARE_PROVIDER_SITE_OTHER): Admitting: Pulmonary Disease

## 2024-03-08 VITALS — BP 157/79 | HR 76 | Ht 64.0 in | Wt 141.0 lb

## 2024-03-08 DIAGNOSIS — R0602 Shortness of breath: Secondary | ICD-10-CM

## 2024-03-08 DIAGNOSIS — R9389 Abnormal findings on diagnostic imaging of other specified body structures: Secondary | ICD-10-CM | POA: Diagnosis not present

## 2024-03-08 DIAGNOSIS — Z87891 Personal history of nicotine dependence: Secondary | ICD-10-CM | POA: Diagnosis not present

## 2024-03-08 DIAGNOSIS — R9431 Abnormal electrocardiogram [ECG] [EKG]: Secondary | ICD-10-CM

## 2024-03-08 NOTE — Patient Instructions (Addendum)
 I am not sure what is causing your shortness of breath at this time  We will check a CT Chest scan to take a close look at the lungs  We will schedule you for pulmonary function tests  We will check and EKG of your heart today  Follow up in 2 months to review the test results

## 2024-03-08 NOTE — Addendum Note (Signed)
 Addended by: Stephaun Million on: 03/08/2024 09:40 AM   Modules accepted: Orders

## 2024-03-08 NOTE — Progress Notes (Addendum)
 Synopsis: Referred in June 2025 for Shortness of breath  Subjective:   PATIENT ID: Brett Keller GENDER: male DOB: 1941/08/08, MRN: [id number]  HPI  Chief Complaint  Patient presents with   Consult    Pt states SOB when first getting in the morning,Hx smoking.   Brett Keller is an 83 year old male, former smoker with history of GERD, hiatal hernia and DMII who is referred to pulmonary clinic for shortness of breath.  PCP notes reviewed.   He experiences shortness of breath for the past three to four weeks, primarily in the mornings upon waking. He can perform activities such as mowing the grass and working around the house without shortness of breath or chest pain. He has no cough, mucus production, chest pain, or heartburn, and does not wake up at night due to shortness of breath.  A chest x-ray indicated a possible spot on the lung, prompting the referral for further evaluation. He has a history of smoking, having quit in 1984 after smoking for about 20 years, half a pack per day. He denies significant dust or chemical exposures from his past work, he worked for [employer]. He manages a hiatal hernia by avoiding eating late at night. There is no family history of lung conditions.   Past Medical History:  Diagnosis Date   GERD (gastroesophageal reflux disease)    Gout    History of hiatal hernia    Hypercholesteremia      No family history on file.   Social History   Socioeconomic History   Marital status: Married    Spouse name: Not on file   Number of children: Not on file   Years of education: Not on file   Highest education level: Not on file  Occupational History   Not on file  Tobacco Use   Smoking status: Former    Current packs/day: 0.00    Types: Cigarettes    Quit date: 09/22/1982    Years since quitting: 41.4   Smokeless tobacco: Never  Substance and Sexual Activity   Alcohol use: No   Drug use: No   Sexual activity: Not on file  Other  Topics Concern   Not on file  Social History Narrative   Not on file   Social Drivers of Health   Financial Resource Strain: Not on file  Food Insecurity: Not on file  Transportation Needs: Not on file  Physical Activity: Not on file  Stress: Not on file  Social Connections: Not on file  Intimate Partner Violence: Not on file     No Known Allergies   Outpatient Medications Prior to Visit  Medication Sig Dispense Refill   allopurinol (ZYLOPRIM) 300 MG tablet Take 300 mg by mouth daily.     atorvastatin (LIPITOR) 20 MG tablet Take 20 mg by mouth daily.     fenofibrate micronized (LOFIBRA) 134 MG capsule Take 134 mg by mouth daily before breakfast.     No facility-administered medications prior to visit.    Review of Systems  Constitutional:  Negative for chills, fever, malaise/fatigue and weight loss.  HENT:  Negative for congestion, sinus pain and sore throat.   Eyes: Negative.   Respiratory:  Positive for cough (after deep inspiration) and shortness of breath. Negative for hemoptysis, sputum production and wheezing.   Cardiovascular:  Negative for chest pain, palpitations, orthopnea, claudication and leg swelling.  Gastrointestinal:  Negative for abdominal pain, heartburn, nausea and vomiting.  Genitourinary: Negative.   Musculoskeletal:  Negative for joint pain and myalgias.  Skin:  Negative for rash.  Neurological:  Negative for weakness.  Endo/Heme/Allergies: Negative.   Psychiatric/Behavioral: Negative.     Objective:   Vitals:   03/08/24 0842  BP: (!) 157/79  Pulse: 76  SpO2: 97%  Weight: 141 lb (64 kg)  Height: 5' 4 (1.626 m)    Physical Exam Constitutional:      General: He is not in acute distress.    Appearance: Normal appearance.   Eyes:     General: No scleral icterus.    Conjunctiva/sclera: Conjunctivae normal.    Cardiovascular:     Rate and Rhythm: Normal rate and regular rhythm.  Pulmonary:     Breath sounds: No wheezing, rhonchi or  rales.   Musculoskeletal:     Right lower leg: No edema.     Left lower leg: No edema.   Skin:    General: Skin is warm and dry.   Neurological:     General: No focal deficit present.    CBC    Component Value Date/Time   WBC 18.5 (H) 10/19/2011 2107   RBC 5.65 10/19/2011 2107   HGB 16.8 10/19/2011 2107   HCT 47.4 10/19/2011 2107   PLT 219 10/19/2011 2107   MCV 83.9 10/19/2011 2107   MCH 29.7 10/19/2011 2107   MCHC 35.4 10/19/2011 2107   RDW 12.5 10/19/2011 2107   LYMPHSABS 1.7 10/19/2011 2107   MONOABS 1.1 (H) 10/19/2011 2107   EOSABS 0.1 10/19/2011 2107   BASOSABS 0.0 10/19/2011 2107     Chest imaging: CXR 02/19/24 - [facility] Low lung volumes, mild atelectasis in lingula   PFT:     No data to display          Labs: 5/30: Ddimer is negative  Path:  Echo:  Heart Catheterization:       Assessment & Plan:   Shortness of breath - Plan: CT CHEST HIGH RESOLUTION, EKG 12-Lead, Pulmonary Function Test  Abnormal x-ray - Plan: CT CHEST HIGH RESOLUTION  Discussion: Brett Keller is an 83 year old male, former smoker with history of GERD, hiatal hernia and DMII who is referred to pulmonary clinic for shortness of breath.  Shortness of breath Shortness of breath for 3-4 weeks, primarily in the mornings. Differential includes atelectasis and smoking-related lung damage. Low D-dimer reduces likelihood of pulmonary embolism. Previous smoking may contribute to symptoms. - Order EKG. - Order chest CT to evaluate for possible ILD - Schedule pulmonary function tests (PFTs). 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 - CT Chest evavluation  Hiatal hernia Hiatal hernia present with occasional reflux when lying  down. - Managed by avoiding eating close to bedtime and laying elevated  Follow up in 2 months to review CT results and PFT results.  Duaine German, MD [facility] Office: [phone]   Current Outpatient Medications:    allopurinol (ZYLOPRIM) 300 MG tablet, Take 300 mg by mouth daily., Disp: , Rfl:    atorvastatin (LIPITOR) 20 MG tablet, Take 20 mg by mouth daily., Disp: , Rfl:    fenofibrate micronized (LOFIBRA) 134 MG capsule, Take 134 mg by mouth daily before breakfast., Disp: , Rfl:

## 2024-04-05 ENCOUNTER — Ambulatory Visit (HOSPITAL_BASED_OUTPATIENT_CLINIC_OR_DEPARTMENT_OTHER)
Admission: RE | Admit: 2024-04-05 | Discharge: 2024-04-05 | Disposition: A | Discharge status: Home / Self Care | Source: Ambulatory Visit | Attending: Pulmonary Disease | Admitting: Pulmonary Disease

## 2024-04-05 DIAGNOSIS — R9389 Abnormal findings on diagnostic imaging of other specified body structures: Secondary | ICD-10-CM | POA: Diagnosis present

## 2024-04-05 DIAGNOSIS — R0602 Shortness of breath: Secondary | ICD-10-CM | POA: Diagnosis present

## 2024-04-12 ENCOUNTER — Encounter

## 2024-04-15 ENCOUNTER — Ambulatory Visit

## 2024-04-15 DIAGNOSIS — R0602 Shortness of breath: Secondary | ICD-10-CM | POA: Diagnosis not present

## 2024-04-15 LAB — PULMONARY FUNCTION TEST
DL/VA % pred: 107 %
DL/VA: 4.26 ml/min/mmHg/L
DLCO unc % pred: 63 %
DLCO unc: 12.63 ml/min/mmHg
FEF 25-75 Post: 3.14 L/s
FEF 25-75 Pre: 2.99 L/s
FEF2575-%Change-Post: 4 %
FEF2575-%Pred-Post: 226 %
FEF2575-%Pred-Pre: 215 %
FEV1-%Change-Post: 11 %
FEV1-%Pred-Post: 107 %
FEV1-%Pred-Pre: 96 %
FEV1-Post: 2.25 L
FEV1-Pre: 2.02 L
FEV1FVC-%Change-Post: -16 %
FEV1FVC-%Pred-Pre: 135 %
FEV6-%Change-Post: 32 %
FEV6-%Pred-Post: 100 %
FEV6-%Pred-Pre: 75 %
FEV6-Post: 2.78 L
FEV6-Pre: 2.09 L
FEV6FVC-%Pred-Post: 108 %
FEV6FVC-%Pred-Pre: 108 %
FVC-%Change-Post: 33 %
FVC-%Pred-Post: 92 %
FVC-%Pred-Pre: 69 %
FVC-Post: 2.8 L
FVC-Pre: 2.09 L
Post FEV1/FVC ratio: 80 %
Post FEV6/FVC ratio: 100 %
Pre FEV1/FVC ratio: 97 %
Pre FEV6/FVC Ratio: 100 %
RV % pred: 200 %
RV: 4.71 L
TLC % pred: 119 %
TLC: 6.99 L

## 2024-04-15 NOTE — Patient Instructions (Signed)
 Full pft performed today.

## 2024-04-15 NOTE — Progress Notes (Signed)
 Full pft performed today.

## 2024-04-24 ENCOUNTER — Ambulatory Visit: Payer: Self-pay | Admitting: Pulmonary Disease

## 2024-04-24 NOTE — Progress Notes (Signed)
 Patient's CT scan shows non-specific inflammation of the lower lungs. We will discuss further at follow up. Radiologist recommend a follow up CT scan in 1 year.

## 2024-04-25 NOTE — Telephone Encounter (Signed)
No answer.  LM to return call

## 2024-04-27 ENCOUNTER — Telehealth: Payer: Self-pay

## 2024-04-27 NOTE — Telephone Encounter (Signed)
 Copied from CRM [id number]. Topic: Clinical - Lab/Test Results >> Apr 26, 2024 11:29 AM Brett Keller wrote: Reason for CRM: Patient is returning call regarding lab results. Relayed the results verbatim, patient voices understanding but would like to go over with provider at follow-up appointment.    Spoke with patient about result and was told that will discuss further att follow up Appt. Next month nothing further to go over at this time    NFN-

## 2024-04-27 NOTE — Telephone Encounter (Signed)
 Spoke with patient verbalized understanding   Looks forward to seeing you at next ppt.   NFN-

## 2024-06-21 ENCOUNTER — Ambulatory Visit (INDEPENDENT_AMBULATORY_CARE_PROVIDER_SITE_OTHER): Admitting: Pulmonary Disease

## 2024-06-21 ENCOUNTER — Encounter: Payer: Self-pay | Admitting: Pulmonary Disease

## 2024-06-21 VITALS — BP 149/79 | HR 62 | Ht 65.0 in | Wt 146.0 lb

## 2024-06-21 DIAGNOSIS — K449 Diaphragmatic hernia without obstruction or gangrene: Secondary | ICD-10-CM | POA: Diagnosis not present

## 2024-06-21 DIAGNOSIS — R918 Other nonspecific abnormal finding of lung field: Secondary | ICD-10-CM | POA: Diagnosis not present

## 2024-06-21 DIAGNOSIS — J449 Chronic obstructive pulmonary disease, unspecified: Secondary | ICD-10-CM | POA: Diagnosis not present

## 2024-06-21 DIAGNOSIS — Z87891 Personal history of nicotine dependence: Secondary | ICD-10-CM | POA: Diagnosis not present

## 2024-06-21 MED ORDER — ALBUTEROL SULFATE HFA 108 (90 BASE) MCG/ACT IN AERS: 2.0000 | INHALATION_SPRAY | Freq: Four times a day (QID) | RESPIRATORY_TRACT | 6 refills | Status: AC | PRN

## 2024-06-21 NOTE — Patient Instructions (Signed)
 Your breathing tests are concerning for obstructive lung disease  Your CT Chest scan shows non-specific inflammation and pulmonary nodules  We will schedule you for a CT Chest scan in January to monitor the spots on your lung  Use albuterol inhaler 1-2 puffs every 4-6 hours as needed  Follow up in January after CT Chest scan

## 2024-06-21 NOTE — Progress Notes (Unsigned)
 Synopsis: Referred in June 2025 for Shortness of breath  Subjective:   PATIENT ID: Brett Keller Meeker GENDER: male DOB: 1941-09-21, MRN: [id number]  HPI  Chief Complaint  Patient presents with   Medical Management of Chronic Issues   Brett Keller is an 83 year old male, former smoker with history of GERD, hiatal hernia and DMII who who returns to pulmonary clinic for shortness of breath.  Initial OV 03/08/24 PCP notes reviewed.   He experiences shortness of breath for the past three to four weeks, primarily in the mornings upon waking. He can perform activities such as mowing the grass and working around the house without shortness of breath or chest pain. He has no cough, mucus production, chest pain, or heartburn, and does not wake up at night due to shortness of breath.  A chest x-ray indicated a possible spot on the lung, prompting the referral for further evaluation. He has a history of smoking, having quit in 1984 after smoking for about 20 years, half a pack per day. He denies significant dust or chemical exposures from his past work, he worked for [employer]. He manages a hiatal hernia by avoiding eating late at night. There is no family history of lung conditions.  OV 06/21/24 He experiences shortness of breath primarily upon waking up in the morning while still lying down, with no significant symptoms during the day or with activities such as walking.   PFTs show normal spirometry, air trapping and mild diffusion defect.  A CT scan in July showed mild inflammatory changes and air trapping. There is no snoring at night, and his wife has not reported any. An EKG showed signs of possible old heart damage, and he has a cardiology appointment scheduled for tomorrow.  Past Medical History:  Diagnosis Date   GERD (gastroesophageal reflux disease)    Gout    History of hiatal hernia    Hypercholesteremia      No family history on file.   Social History   Socioeconomic  History   Marital status: Married    Spouse name: Not on file   Number of children: Not on file   Years of education: Not on file   Highest education level: Not on file  Occupational History   Not on file  Tobacco Use   Smoking status: Former    Current packs/day: 0.00    Types: Cigarettes    Quit date: 09/22/1982    Years since quitting: 41.7   Smokeless tobacco: Never  Substance and Sexual Activity   Alcohol use: No   Drug use: No   Sexual activity: Not on file  Other Topics Concern   Not on file  Social History Narrative   Not on file   Social Drivers of Health   Financial Resource Strain: Not on file  Food Insecurity: Not on file  Transportation Needs: Not on file  Physical Activity: Not on file  Stress: Not on file  Social Connections: Not on file  Intimate Partner Violence: Not on file     No Known Allergies   Outpatient Medications Prior to Visit  Medication Sig Dispense Refill   allopurinol (ZYLOPRIM) 300 MG tablet Take 300 mg by mouth daily.     atorvastatin (LIPITOR) 20 MG tablet Take 20 mg by mouth daily.     fenofibrate micronized (LOFIBRA) 134 MG capsule Take 134 mg by mouth daily before breakfast.     No facility-administered medications prior to visit.   Review  of Systems  Constitutional:  Negative for chills, fever, malaise/fatigue and weight loss.  HENT:  Negative for congestion, sinus pain and sore throat.   Eyes: Negative.   Respiratory:  Positive for cough (after deep inspiration) and shortness of breath. Negative for hemoptysis, sputum production and wheezing.   Cardiovascular:  Negative for chest pain, palpitations, orthopnea, claudication and leg swelling.  Gastrointestinal:  Negative for abdominal pain, heartburn, nausea and vomiting.  Genitourinary: Negative.   Musculoskeletal:  Negative for joint pain and myalgias.  Skin:  Negative for rash.  Neurological:  Negative for weakness.  Endo/Heme/Allergies: Negative.   Psychiatric/Behavioral:  Negative.     Objective:   Vitals:   06/21/24 0929  BP: (!) 149/79  Pulse: 62  SpO2: 96%  Weight: 146 lb (66.2 kg)  Height: 5' 5 (1.651 m)     Physical Exam Constitutional:      General: He is not in acute distress.    Appearance: Normal appearance.  Eyes:     General: No scleral icterus.    Conjunctiva/sclera: Conjunctivae normal.  Cardiovascular:     Rate and Rhythm: Normal rate and regular rhythm.  Pulmonary:     Breath sounds: No wheezing, rhonchi or rales.  Musculoskeletal:     Right lower leg: No edema.     Left lower leg: No edema.  Skin:    General: Skin is warm and dry.  Neurological:     General: No focal deficit present.    CBC    Component Value Date/Time   WBC 18.5 (H) 10/19/2011 2107   RBC 5.65 10/19/2011 2107   HGB 16.8 10/19/2011 2107   HCT 47.4 10/19/2011 2107   PLT 219 10/19/2011 2107   MCV 83.9 10/19/2011 2107   MCH 29.7 10/19/2011 2107   MCHC 35.4 10/19/2011 2107   RDW 12.5 10/19/2011 2107   LYMPHSABS 1.7 10/19/2011 2107   MONOABS 1.1 (H) 10/19/2011 2107   EOSABS 0.1 10/19/2011 2107   BASOSABS 0.0 10/19/2011 2107     Chest imaging: HRCT Chest 04/05/24 1. Mild irregular peripheral interstitial opacity and septal thickening in the bilateral lung bases, more conspicuous on the right. Additional bland appearing, bandlike scarring. Minimal tubular bronchiectasis. Findings are most consistent with nonspecific postinfectious or inflammatory scarring, however minimal interstitial lung disease is not strictly excluded. Consider follow-up at 1 year if indicated by strong clinical concern for fibrotic interstitial lung disease. Findings are indeterminate for UIP per consensus guidelines: Diagnosis of Idiopathic Pulmonary Fibrosis: An Official ATS/ERS/JRS/ALAT Clinical Practice Guideline. Am JINNY Honey Crit Care Med Vol 198, Iss 5, [phone], May 23 2017. 2. Multilobulated nodule in the right upper lobe measuring 0.8 x 0.8 cm, modestly  suspicious for primary lung malignancy. Elongated 0.7 cm nodule of the lingula, morphologically less suspicious although indeterminate. Non-contrast chest CT at 6-12 months is recommended. If the nodule is stable at time of repeat CT, then future CT at 18-24 months (from today's scan) is considered optional for low-risk patients, but is recommended for high-risk patients. This recommendation follows the consensus statement: Guidelines for Management of Incidental Pulmonary Nodules Detected on CT Images: From the Fleischner Society 2017; Radiology 2017; 284:228-243. 3. Coronary artery disease. 4. Cholelithiasis.  CXR 02/19/24 - [facility] Low lung volumes, mild atelectasis in lingula   PFT:    Latest Ref Rng & Units 04/15/2024    9:53 AM  PFT Results  FVC-Pre L 2.09   FVC-Predicted Pre % 69   FVC-Post L 2.80   FVC-Predicted Post % 92  Pre FEV1/FVC % % 97   Post FEV1/FCV % % 80   FEV1-Pre L 2.02   FEV1-Predicted Pre % 96   FEV1-Post L 2.25   DLCO uncorrected ml/min/mmHg 12.63   DLCO UNC% % 63   DLVA Predicted % 107   TLC L 6.99   TLC % Predicted % 119   RV % Predicted % 200     Labs: 5/30: Ddimer is negative  Path:  Echo:  Heart Catheterization:       Assessment & Plan:   Pulmonary nodules  Discussion: Brett Keller is an 83 year old male, former smoker with history of GERD, hiatal hernia and DMII who is referred to pulmonary clinic for shortness of breath.  Obstructive lung disease - Use albuterol inhaler 1-2 puffs every 4-6 hours as needed - Educated on inhaler use and its potential benefits for symptom relief.  Pulmonary nodules Pulmonary nodules identified on CT scan with mild inflammation changes and air trapping. Monitoring required to assess for changes over time. - Order follow-up CT scan in January to monitor pulmonary nodules. - Review CT scan results in follow-up visit.  Possible old myocardial infarction EKG shows signs of possible old  myocardial infarction.  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 - Managed by avoiding eating close to bedtime and laying elevated  Follow up in January after CT Chest scan.  Dorn Chill, MD [facility] Office: [phone]   Current Outpatient Medications:    allopurinol (ZYLOPRIM) 300 MG tablet, Take 300 mg by mouth daily., Disp: , Rfl:    atorvastatin (LIPITOR) 20 MG tablet, Take 20 mg by mouth daily., Disp: , Rfl:    fenofibrate micronized (LOFIBRA) 134 MG capsule, Take 134 mg by mouth daily before breakfast., Disp: , Rfl:

## 2024-06-22 ENCOUNTER — Ambulatory Visit: Attending: Cardiovascular Disease | Admitting: Cardiovascular Disease

## 2024-06-22 ENCOUNTER — Encounter: Payer: Self-pay | Admitting: Cardiovascular Disease

## 2024-06-22 VITALS — BP 134/60 | HR 58 | Resp 16 | Ht 65.0 in | Wt 147.6 lb

## 2024-06-22 DIAGNOSIS — R0602 Shortness of breath: Secondary | ICD-10-CM | POA: Diagnosis not present

## 2024-06-22 DIAGNOSIS — E782 Mixed hyperlipidemia: Secondary | ICD-10-CM | POA: Diagnosis not present

## 2024-06-22 DIAGNOSIS — I2510 Atherosclerotic heart disease of native coronary artery without angina pectoris: Secondary | ICD-10-CM | POA: Diagnosis not present

## 2024-06-22 DIAGNOSIS — R9431 Abnormal electrocardiogram [ECG] [EKG]: Secondary | ICD-10-CM | POA: Diagnosis not present

## 2024-06-22 DIAGNOSIS — E785 Hyperlipidemia, unspecified: Secondary | ICD-10-CM | POA: Insufficient documentation

## 2024-06-22 NOTE — Progress Notes (Signed)
 06/22/2024 Brett Keller   [birth year]  [id number]  Primary Physician Brett Tanda Mae, MD Primary Cardiologist: Brett JINNY Lesches MD Brett Keller, [place]  HPI:  Brett Keller is a 83 y.o. thin-appearing married Caucasian male father of 1 child, grandfather 1 grandchild he is retired from working for the city of [employer].  He was referred by Dr. Deetta, his pulmonologist, because of abnormal EKG.  His risk factors include remote tobacco abuse having quit in 1980.  He does have treated hypertension hyperlipidemia.  There is no family history of heart disease.  He is never had a heart attack or stroke.  He does complain of some shortness of breath when waking up in the morning but not with exertion.  He does do yard work without symptoms.  He apparently had a EKG done with his pulmonologist that showed inferior Q waves however EKG today was normal.  He did have a chest CT performed 04/05/2024 that that that showed three-vessel coronary calcification.  The patient specifically denies chest pain.   Current Meds  Medication Sig   albuterol (VENTOLIN HFA) 108 (90 Base) MCG/ACT inhaler Inhale 2 puffs into the lungs every 6 (six) hours as needed for wheezing or shortness of breath.   allopurinol (ZYLOPRIM) 300 MG tablet Take 300 mg by mouth daily.   empagliflozin (JARDIANCE) 25 MG TABS tablet Take 25 mg by mouth daily.   fenofibrate micronized (LOFIBRA) 134 MG capsule Take 134 mg by mouth daily before breakfast.   rosuvastatin (CRESTOR) 10 MG tablet Take 10 mg by mouth daily.   [DISCONTINUED] atorvastatin (LIPITOR) 20 MG tablet Take 20 mg by mouth daily.     No Known Allergies  Social History   Socioeconomic History   Marital status: Married    Spouse name: Not on file   Number of children: Not on file   Years of education: Not on file   Highest education level: Not on file  Occupational History   Not on file  Tobacco Use   Smoking status: Former    Current packs/day: 0.00     Types: Cigarettes    Quit date: 09/22/1982    Years since quitting: 41.7   Smokeless tobacco: Never  Vaping Use   Vaping status: Never Used  Substance and Sexual Activity   Alcohol use: No   Drug use: No   Sexual activity: Not on file  Other Topics Concern   Not on file  Social History Narrative   Not on file   Social Drivers of Health   Financial Resource Strain: Not on file  Food Insecurity: Not on file  Transportation Needs: Not on file  Physical Activity: Not on file  Stress: Not on file  Social Connections: Not on file  Intimate Partner Violence: Not on file     Review of Systems: General: negative for chills, fever, night sweats or weight changes.  Cardiovascular: negative for chest pain, dyspnea on exertion, edema, orthopnea, palpitations, paroxysmal nocturnal dyspnea or shortness of breath Dermatological: negative for rash Respiratory: negative for cough or wheezing Urologic: negative for hematuria Abdominal: negative for nausea, vomiting, diarrhea, bright red blood per rectum, melena, or hematemesis Neurologic: negative for visual changes, syncope, or dizziness All other systems reviewed and are otherwise negative except as noted above.    Blood pressure 134/60, pulse (!) 58, resp. rate 16, height 5' 5 (1.651 m), weight 147 lb 9.6 oz (67 kg), SpO2 97%.  General appearance: alert and no distress Neck:  no adenopathy, no carotid bruit, no JVD, supple, symmetrical, trachea midline, and thyroid not enlarged, symmetric, no tenderness/mass/nodules Lungs: clear to auscultation bilaterally Heart: regular rate and rhythm, S1, S2 normal, no murmur, click, rub or gallop Extremities: extremities normal, atraumatic, no cyanosis or edema Pulses: 2+ and symmetric Skin: Skin color, texture, turgor normal. No rashes or lesions Neurologic: Grossly normal  EKG EKG Interpretation Date/Time:  Wednesday June 22 2024 14:19:31 EDT Ventricular Rate:  56 PR Interval:  214 QRS  Duration:  86 QT Interval:  388 QTC Calculation: 374 R Axis:   -60  Text Interpretation: Sinus bradycardia with 1st degree A-V block Left axis deviation When compared with ECG of 19-Oct-2011 20:25, Vent. rate has decreased BY  57 BPM Confirmed by Brett Keller [phone]) on 06/22/2024 2:35:16 PM    ASSESSMENT AND PLAN:   Hyperlipidemia History of hyperlipidemia on statin therapy with lipid profile performed 9//23 revealing total cholesterol 120, LDL 56 and HDL 38.  Coronary artery calcification seen on CT scan Recent chest CT performed 04/05/2024 showed three-vessel coronary calcification.  I am going to get a coronary calcium score to quantify.  Shortness of breath Remote history of tobacco abuse with shortness breath of breath in the morning when he wakes up but not with exertion.  I will get a 2D echo to further evaluate.  Nonspecific abnormal electrocardiogram (ECG) (EKG) Patient had an EKG back in June that did show either left axis deviation or inferior Q waves.  EKG performed today was completely normal.     Keller Brett Court MD [facility], [facility] 06/22/2024 2:49 PM

## 2024-06-22 NOTE — Patient Instructions (Signed)
 Medication Instructions:  Your physician recommends that you continue on your current medications as directed. Please refer to the Current Medication list given to you today.  *If you need a refill on your cardiac medications before your next appointment, please call your pharmacy*   Testing/Procedures: Your physician has requested that you have an echocardiogram. Echocardiography is a painless test that uses sound waves to create images of your heart. It provides your doctor with information about the size and shape of your heart and how well your heart's chambers and valves are working. This procedure takes approximately one hour. There are no restrictions for this procedure. Please do NOT wear cologne, perfume, aftershave, or lotions (deodorant is allowed). Please arrive 15 minutes prior to your appointment time.  Please note: We ask at that you not bring children with you during ultrasound (echo/ vascular) testing. Due to room size and safety concerns, children are not allowed in the ultrasound rooms during exams. Our front office staff cannot provide observation of children in our lobby area while testing is being conducted. An adult accompanying a patient to their appointment will only be allowed in the ultrasound room at the discretion of the ultrasound technician under special circumstances. We apologize for any inconvenience.   Dr. Court has ordered a CT coronary calcium score.   Test locations:  [facility] at [facility]  [place] [facility] at [facility]  This is $99 out of pocket.   Coronary CalciumScan A coronary calcium scan is an imaging test used to look for deposits of calcium and other fatty materials (plaques) in the inner lining of the blood vessels of the heart (coronary arteries). These deposits of calcium and plaques can partly clog and narrow the coronary arteries without  producing any symptoms or warning signs. This puts a person at risk for a heart attack. This test can detect these deposits before symptoms develop. Tell a health care provider about: Any allergies you have. All medicines you are taking, including vitamins, herbs, eye drops, creams, and over-the-counter medicines. Any problems you or family members have had with anesthetic medicines. Any blood disorders you have. Any surgeries you have had. Any medical conditions you have. Whether you are pregnant or may be pregnant. What are the risks? Generally, this is a safe procedure. However, problems may occur, including: Harm to a pregnant woman and her unborn baby. This test involves the use of radiation. Radiation exposure can be dangerous to a pregnant woman and her unborn baby. If you are pregnant, you generally should not have this procedure done. Slight increase in the risk of cancer. This is because of the radiation involved in the test. What happens before the procedure? No preparation is needed for this procedure. What happens during the procedure? You will undress and remove any jewelry around your neck or chest. You will put on a hospital gown. Sticky electrodes will be placed on your chest. The electrodes will be connected to an electrocardiogram (ECG) machine to record a tracing of the electrical activity of your heart. A CT scanner will take pictures of your heart. During this time, you will be asked to lie still and hold your breath for 2-3 seconds while a picture of your heart is being taken. The procedure may vary among health care providers and hospitals. What happens after the procedure? You can get dressed. You can return to your normal activities. It is up to you to  get the results of your test. Ask your health care provider, or the department that is doing the test, when your results will be ready. Summary A coronary calcium scan is an imaging test used to look for deposits of  calcium and other fatty materials (plaques) in the inner lining of the blood vessels of the heart (coronary arteries). Generally, this is a safe procedure. Tell your health care provider if you are pregnant or may be pregnant. No preparation is needed for this procedure. A CT scanner will take pictures of your heart. You can return to your normal activities after the scan is done. This information is not intended to replace advice given to you by your health care provider. Make sure you discuss any questions you have with your health care provider. Document Released: 03/06/2008 Document Revised: 07/28/2016 Document Reviewed: 07/28/2016 Elsevier Interactive Patient Education  2017 [employer].    Follow-Up: At [facility], you and your health needs are our priority.  As part of our continuing mission to provide you with exceptional heart care, our providers are all part of one team.  This team includes your primary Cardiologist (physician) and Advanced Practice Providers or APPs (Physician Assistants and Nurse Practitioners) who all work together to provide you with the care you need, when you need it.  Your next appointment:   We will see you on an as needed basis   Provider:   Dorn Lesches, MD     We recommend signing up for the patient portal called MyChart.  Sign up information is provided on this After Visit Summary.  MyChart is used to connect with patients for Virtual Visits (Telemedicine).  Patients are able to view lab/test results, encounter notes, upcoming appointments, etc.  Non-urgent messages can be sent to your provider as well.   To learn more about what you can do with MyChart, go to [email or web address].

## 2024-06-22 NOTE — Assessment & Plan Note (Signed)
 History of hyperlipidemia on statin therapy with lipid profile performed 9//23 revealing total cholesterol 120, LDL 56 and HDL 38.

## 2024-06-22 NOTE — Assessment & Plan Note (Signed)
 Remote history of tobacco abuse with shortness breath of breath in the morning when he wakes up but not with exertion.  I will get a 2D echo to further evaluate.

## 2024-06-22 NOTE — Assessment & Plan Note (Signed)
 Recent chest CT performed 04/05/2024 showed three-vessel coronary calcification.  I am going to get a coronary calcium score to quantify.

## 2024-06-22 NOTE — Assessment & Plan Note (Signed)
 Patient had an EKG back in June that did show either left axis deviation or inferior Q waves.  EKG performed today was completely normal.

## 2024-06-24 ENCOUNTER — Encounter: Payer: Self-pay | Admitting: Pulmonary Disease

## 2024-08-04 ENCOUNTER — Ambulatory Visit (HOSPITAL_COMMUNITY)
Admission: RE | Admit: 2024-08-04 | Discharge: 2024-08-04 | Disposition: A | Discharge status: Home / Self Care | Payer: Self-pay | Source: Ambulatory Visit | Attending: Cardiology | Admitting: Cardiology

## 2024-08-04 DIAGNOSIS — R0602 Shortness of breath: Secondary | ICD-10-CM | POA: Insufficient documentation

## 2024-08-04 DIAGNOSIS — E782 Mixed hyperlipidemia: Secondary | ICD-10-CM | POA: Insufficient documentation

## 2024-08-04 DIAGNOSIS — I2510 Atherosclerotic heart disease of native coronary artery without angina pectoris: Secondary | ICD-10-CM | POA: Insufficient documentation

## 2024-08-04 DIAGNOSIS — R9431 Abnormal electrocardiogram [ECG] [EKG]: Secondary | ICD-10-CM | POA: Insufficient documentation

## 2024-08-04 LAB — ECHOCARDIOGRAM COMPLETE
Area-P 1/2: 3.93 cm2
S' Lateral: 2.3 cm

## 2024-08-05 ENCOUNTER — Ambulatory Visit: Payer: Self-pay | Admitting: Cardiovascular Disease

## 2024-09-30 ENCOUNTER — Ambulatory Visit (HOSPITAL_COMMUNITY)
Admission: RE | Admit: 2024-09-30 | Discharge: 2024-09-30 | Disposition: A | Source: Ambulatory Visit | Attending: Pulmonary Disease

## 2024-09-30 DIAGNOSIS — R918 Other nonspecific abnormal finding of lung field: Secondary | ICD-10-CM | POA: Diagnosis present

## 2024-10-07 ENCOUNTER — Ambulatory Visit: Payer: Self-pay | Admitting: Emergency Medicine

## 2024-10-07 NOTE — Telephone Encounter (Signed)
 Spoke with patient Brett Keller, has been scheduled for f/u

## 2024-10-31 ENCOUNTER — Ambulatory Visit: Admitting: Pulmonary Disease
